# Patient Record
Sex: Female | Born: 1980 | Race: White | Hispanic: No | Marital: Married | Smoking: Never smoker
Health system: Southern US, Community
[De-identification: ages and names within clinical notes are randomized; demographics above are authoritative.]

## PROBLEM LIST (undated history)

## (undated) DIAGNOSIS — F32A Depression, unspecified: Secondary | ICD-10-CM

## (undated) DIAGNOSIS — T7840XA Allergy, unspecified, initial encounter: Secondary | ICD-10-CM

## (undated) DIAGNOSIS — O4400 Placenta previa specified as without hemorrhage, unspecified trimester: Secondary | ICD-10-CM

## (undated) DIAGNOSIS — R5383 Other fatigue: Secondary | ICD-10-CM

## (undated) DIAGNOSIS — Z3481 Encounter for supervision of other normal pregnancy, first trimester: Secondary | ICD-10-CM

## (undated) DIAGNOSIS — Z3A09 9 weeks gestation of pregnancy: Secondary | ICD-10-CM

## (undated) DIAGNOSIS — M545 Low back pain, unspecified: Secondary | ICD-10-CM

## (undated) DIAGNOSIS — O4402 Placenta previa specified as without hemorrhage, second trimester: Secondary | ICD-10-CM

## (undated) DIAGNOSIS — O4440 Low lying placenta NOS or without hemorrhage, unspecified trimester: Secondary | ICD-10-CM

## (undated) DIAGNOSIS — F419 Anxiety disorder, unspecified: Secondary | ICD-10-CM

## (undated) HISTORY — DX: Allergy, unspecified, initial encounter: T78.40XA

## (undated) HISTORY — PX: WISDOM TOOTH EXTRACTION: SHX21

## (undated) HISTORY — DX: Depression, unspecified: F32.A

## (undated) HISTORY — DX: Anxiety disorder, unspecified: F41.9

---

## 2014-03-19 ENCOUNTER — Ambulatory Visit
Admit: 2014-03-19 | Discharge: 2014-03-19 | Payer: PRIVATE HEALTH INSURANCE | Attending: Advanced Practice Midwife | Primary: Family Medicine

## 2014-03-19 DIAGNOSIS — O99345 Other mental disorders complicating the puerperium: Secondary | ICD-10-CM

## 2014-03-19 NOTE — Progress Notes (Signed)
Angela Raymond  03/19/2014    Date Of Birth:  24-Dec-1980      Chief Complaint   Patient presents with   ??? Contraception     Discuss BC            HPI:  Angela AllegraHeather Raymond is a 33 y.o. femaleThe patient was seen today. She is here regarding postpartum depression and needs birth control.  Has been using condoms, but had one break and the thought of pregnancy now really scared her.  She has seen a psychiatrist for her PP anxiety and depression and they told her to start Lexapro in Sept.  She hasn't done it yet and is very tearful.  Knows she will likely feel better by taking it.  She is still breastfeeding her 279 month old with mixed feelings about it.  Will likely let her wean within the next couple months.  Took a pill in the past, but interested in having another baby maybe a year from now.  Would consider vaginal ring.   Her sister is having some thyroid issues and she would like her labs to be checked along with vit D.  Her bowels are regular and she is voiding without difficulty.       Obstetric History    G1   P1   T0   P1   A0   TAB0   SAB0   E0   M0   L1       # Outcome Date GA Lbr Len/2nd Weight Sex Delivery Anes PTL Lv   1 Preterm 06/2013 [id number]  5 lb 7 oz (2.466 kg) F Vag-Spont   Y          Past Medical History   Diagnosis Date   ??? Depression 2003     counseling   ??? Anxiety 2003     counseling   ??? Abnormal Pap smear of cervix 2007       No past surgical history on file.    Family History   Problem Relation Age of Onset   ??? Diabetes Paternal Grandmother    ??? Osteoporosis Maternal Grandmother    ??? Breast Cancer Maternal Grandmother    ??? Hypertension Father    ??? Thyroid Disease Mother    ??? Thyroid Cancer Mother    ??? Mental Illness Maternal Uncle        History     Social History   ??? Marital Status: Married     Spouse Name: N/A     Number of Children: N/A   ??? Years of Education: N/A     Occupational History   ??? Not on file.     Social History Main Topics   ??? Smoking status: Never Smoker    ??? Smokeless tobacco: Not on file    ??? Alcohol Use: Yes   ??? Drug Use: Not on file   ??? Sexual Activity: Not on file     Other Topics Concern   ??? Not on file     Social History Narrative         MEDICATIONS:  Current Outpatient Prescriptions   Medication Sig Dispense Refill   ??? Prenatal Vit-Fe Fumarate-FA (PRENATAL PO) Take by mouth       No current facility-administered medications for this visit.             ALLERGIES:  Allergies as of 03/19/2014   ??? (No Known Allergies)  PERTINENT POSITIVES SEE HPI    BP 114/62 mmHg   Wt 116 lb (52.617 kg)   LMP 03/16/2014     General: A&O x3 NAD  Eyes:normal sclera  Neck: Supple  Neuro: no gross motor deficits  Extremities: No calf tenderness, DTR 2+, and No edema bilaterally  Psych: normal affect and mood      Diagnostics:    No results found.     Lab Results:  No results found for this or any previous visit.      Angela Raymond was seen today for contraception.    Diagnoses and associated orders for this visit:    Postpartum anxiety  - TSH without Reflex; Future  - T4, free; Future  - Vitamin D 1,25 Dihydroxy; Future  - TSH without Reflex  - T4, free  - Vitamin D 1,25 Dihydroxy           Return in about 3 months (around 06/19/2014) for Annual, Labs to be done Today.      Patient was seen with total face to face time of 30 minutes. More than 50% of this visit was counseling and education regarding The encounter diagnosis was Postpartum anxiety. and Contraception   as well as  counseling on preventative health maintenance follow-up.

## 2014-03-20 LAB — T4, FREE: T4 Free: 0.92 nG/dL (ref 0.76–1.46)

## 2014-03-20 LAB — TSH: TSH: 1.28 u[IU]/mL (ref 0.358–3.74)

## 2014-03-20 LAB — VITAMIN D 25 HYDROXY: Vit D, 25-Hydroxy: 26.1 nG/mL — ABNORMAL LOW (ref 30–100)

## 2014-03-20 MED ORDER — ETONOGESTREL-ETHINYL ESTRADIOL 0.12-0.015 MG/24HR VA RING
VAGINAL | Status: DC
Start: 2014-03-20 — End: 2014-07-02

## 2014-03-20 NOTE — Telephone Encounter (Signed)
Can you tell me what our Vit D Protocol is please.  Thanks, Vernona RiegerLaura

## 2014-03-20 NOTE — Telephone Encounter (Signed)
-----   Message from Laura D Neal, CNM sent at 03/20/2014  1:33 PM EST -----  Please notify pt that her thyroid labs are normal, but her vitamin D level is low at 26.  Please place her on the Vitamin D protocol.  Thanks, LN

## 2014-03-20 NOTE — Telephone Encounter (Signed)
Aware, Please send in Pended medication as above.    Patient is still using Nuvaring samples.

## 2014-03-20 NOTE — Telephone Encounter (Signed)
LM for pt to return our call.

## 2014-03-20 NOTE — Telephone Encounter (Signed)
-----   Message from Nyra JabsLaura D Neal, CNM sent at 03/20/2014  1:33 PM EST -----  Please notify pt that her thyroid labs are normal, but her vitamin D level is low at 26.  Please place her on the Vitamin D protocol.  Thanks, LN

## 2014-03-27 NOTE — Telephone Encounter (Signed)
OFFICE INFO calling to check on Vitamin D Rx [facility] [phone]

## 2014-03-28 NOTE — Telephone Encounter (Signed)
Protocol-  If Vitamin D3 level is low- will need to take 50,000 IU qweek x12 & then have level rechecked- D3 medication is pended- okay to fax- pt will check w/ pharmacy later

## 2014-04-01 NOTE — Telephone Encounter (Signed)
Vitamin D3 is pended above,  Please Send to [facility]. 2nd request.   To LNeal.

## 2014-04-02 MED ORDER — CHOLECALCIFEROL 1.25 MG (50000 UT) PO CAPS
50000 UNIT | ORAL_CAPSULE | ORAL | Status: DC
Start: 2014-04-02 — End: 2014-04-16

## 2014-04-02 NOTE — Telephone Encounter (Signed)
Thanks for protocol.  Rx pended was 50,000 IU daily not weekly.  I had to be sure the right dosage was weekly but didn't know the protocol.  I changed and signed the rx and sent it to pharmacy.  Thanks, LN

## 2014-04-02 NOTE — Telephone Encounter (Signed)
Pt called and said that she started taking Lexapro for two weeks now and she has been experiencing heart palpations, also she has broken out in hives three separate times. She had taken Benadryl to help with the hives and a cold compress. Heart palpations are daily and are off and on.

## 2014-04-07 NOTE — Telephone Encounter (Signed)
Please tell pt to cut the pill in 1/2 and only take 1/2 tablet. Thanks

## 2014-04-07 NOTE — Telephone Encounter (Signed)
Spoke with patient, relayed Dr. Casandra DoffingHutton's message. She is going to try this for a week and if it continues she will call the office back.

## 2014-04-16 MED ORDER — VITAMIN D3 75 MCG (3000 UT) PO TABS
75 MCG (3000 UT) | ORAL_TABLET | Freq: Every day | ORAL | Status: DC
Start: 2014-04-16 — End: 2015-08-31

## 2014-04-16 NOTE — Telephone Encounter (Signed)
Pt calls, was prescribed Vit D3 50000 IU for Vit D protocol, took first capsule last week, was "bloated and gassy for 4 days", took second capsule yesterday, has been "very nauseated yesterday and today", states that she can't think of any other reason, except that she took the Vit D3; wants to know if she can possibly take a smaller dose of it.  Please advise.  Triage, pt wants a message left on her cell phone, informed her limited info would be left, due to privacy act.  Pt states that she doesn't want to sit on hold for 20 minutes until she can speak with someone.  Message sent to LN for review.

## 2014-04-16 NOTE — Telephone Encounter (Signed)
I sent in a rx for her to take 2 capsules of 3,000 IU daily.  This will add up to 42,000 IU weekly, which is slightly less than 50,000.  But the only thing I could order by prescription.  She could by some OTC and take an additional 8,000 IU D3 throughout the week if she wants.  Then she has a total of the 50,000 IU just divided up and may tolerate it better.  Please advise pt.  Thanks, LN

## 2014-04-17 NOTE — Telephone Encounter (Signed)
LM on patient's voice mail, as requested.

## 2014-04-24 ENCOUNTER — Encounter: Attending: Psychiatry | Primary: Family Medicine

## 2014-04-30 NOTE — Telephone Encounter (Signed)
Patient doesn't feel like her Lexapro is working right and is having adverse reactions to it. Would like you to call her back when you are available.

## 2014-04-30 NOTE — Telephone Encounter (Signed)
Pt was prescribed a NuvaRing but would rather have an oral contraception. Also thinks the Lexapro prescribed by psychiatrist is not  working and she has a side effect of a low libido.

## 2014-05-01 MED ORDER — NORETHIN ACE-ETH ESTRAD-FE 1-20 MG-MCG PO TABS
1-20 MG-MCG | PACK | Freq: Every day | ORAL | Status: DC
Start: 2014-05-01 — End: 2014-07-02

## 2014-05-01 NOTE — Telephone Encounter (Signed)
I will send in Rx for an oral contraceptive pill.  To start with her next period.  She should talk to her psychiatrist about her medication not working.  Low libido can be a side effect of the antidepressants.  Low libido also is very normal when having a young baby.  Please notify pt, thanks, LN

## 2014-05-06 NOTE — Telephone Encounter (Signed)
PT called to status of message. PT stated she did not take the LExapro yesterday. Having bad dreams, medication not helping depression. WOuld like to talk to someone.

## 2014-05-08 NOTE — Telephone Encounter (Signed)
LM for pt to return our call.

## 2014-05-12 MED ORDER — BUPROPION HCL ER (XL) 150 MG PO TB24
150 MG | ORAL_TABLET | Freq: Every morning | ORAL | Status: DC
Start: 2014-05-12 — End: 2014-07-02

## 2014-05-12 NOTE — Telephone Encounter (Signed)
Left detailed message on machine: since pt has stopped lexapro will start wellbutrin which has helped mom and sister and decreased sexual side effects as well. Risks and benefits stated.  Script sent.  Pt has f/u appt 2/17

## 2014-05-13 NOTE — Telephone Encounter (Signed)
Pt notified. Pt has appts. Needed to f/u on her low Libido and antidepressant. Will call if needs anything.

## 2014-06-25 ENCOUNTER — Encounter: Attending: Psychiatry | Primary: Family Medicine

## 2014-06-25 ENCOUNTER — Encounter: Attending: Advanced Practice Midwife | Primary: Family Medicine

## 2014-07-02 ENCOUNTER — Ambulatory Visit
Admit: 2014-07-02 | Discharge: 2014-07-02 | Payer: PRIVATE HEALTH INSURANCE | Attending: Advanced Practice Midwife | Primary: Family Medicine

## 2014-07-02 DIAGNOSIS — Z01419 Encounter for gynecological examination (general) (routine) without abnormal findings: Secondary | ICD-10-CM

## 2014-07-02 MED ORDER — NORETHINDRONE ACET-ETHINYL EST 1-20 MG-MCG PO TABS
1-20 MG-MCG | ORAL_TABLET | Freq: Every day | ORAL | Status: DC
Start: 2014-07-02 — End: 2014-11-26

## 2014-07-02 NOTE — Progress Notes (Signed)
SUMAN TRIVEDI  07/02/2014              34 y.o.            Primary Care Physician: No primary provider on file.  Chief Complaint   Patient presents with   ??? Gynecologic Exam     HPI : Angela Raymond is a 34 y.o. female here for annual exam.  ____________________________________________________________________  Gynecologic History:    Patient's last menstrual period was 06/24/2014 (approximate).  Menses are regular. Menses occur every regular every 28-30 days.  Flow is moderate.  Intermenstrual bleeding: No  Dysmenorrhea:No    Daughter is turning 59 year old.  Married.  May plan another pregnancy in the next year, will take PNV prior.  Stopped taking her Lexapro due to sexual side effects that resolved after she stopped.  She has been seeing her counselor and is managing her anxiety well now.  Taking 6000 IU of Vit D daily, was checked by her PCP and level was up to normal, although pt was unsure of level.  She will send [place] a message letting us know what her level of Vit D increased to.  Would like to start birth control pill.  Done nursing, did it until 11 months.      Sexually Active: Yes    STD History: No    Reversible Birth Control:Yes: condoms, didn't start prior birth control pill rx that was sent        Preventative Health Testing:  Date of Last Pap Smear: no ecc last pap, done today      Obstetric History    G1   P1   T0   P1   A0   TAB0   SAB0   E0   M0   L1       # Outcome Date GA Lbr Len/2nd Weight Sex Delivery Anes PTL Lv   1 Preterm 06/2013 [redacted]w[redacted]d  5 lb 7 oz (2.466 kg) F Vag-Spont   Y        Past Medical History   Diagnosis Date   ??? Depression 2003     counseling   ??? Anxiety 2003     counseling   ??? Abnormal Pap smear of cervix 2007                                                                   History reviewed. No pertinent past surgical history.  Family History   Problem Relation Age of Onset   ??? Diabetes Paternal Grandmother    ??? Osteoporosis Maternal Grandmother    ??? Breast Cancer Maternal  Grandmother    ??? Hypertension Father    ??? Thyroid Disease Mother    ??? Thyroid Cancer Mother    ??? Mental Illness Maternal Uncle      History     Social History   ??? Marital Status: Married     Spouse Name: N/A     Number of Children: N/A   ??? Years of Education: N/A     Occupational History   ??? Not on file.     Social History Main Topics   ??? Smoking status: Never Smoker    ??? Smokeless tobacco: Never Used   ??? Alcohol  Use: 0.0 oz/week     0 Not specified per week   ??? Drug Use: Not on file   ??? Sexual Activity:     Partners: Male      Comment: none     Other Topics Concern   ??? Not on file     Social History Narrative       MEDICATIONS:  Current Outpatient Prescriptions   Medication Sig Dispense Refill   ??? Multiple Vitamins-Minerals (MULTIVITAMIN ADULT PO) Take by mouth daily     ??? Cholecalciferol (VITAMIN D3) 3000 UNITS TABS Take 2 capsules by mouth daily 60 tablet 2     No current facility-administered medications for this visit.       ALLERGIES:  Allergies as of 07/02/2014   ??? (No Known Allergies)       REVIEW OF SYSTEMS:     CONSTIUTIONAL: No fever, chills or malaise; No weight change or fatigue  CV: No Chest Pain with Exertion, Palpitations, Syncope, Edema, Arrhythmia  RESPIRATORY: No SOB, Pneumoniae,Cough,   BREAST: No breast abnormalities or lumps  GI: No Indigestion, Heartburn, Nausea, vomiting, Diarrhea, Constipation,Bloating or Bowel Changes; No Bloody Stools or melena  GU: No Dysuria, Hematuria or Nocturia. No Urinary Incontinence or Vaginal Discharge,vaginal bleeding, or dysparuenia.   NEURO: No CVA, Migraines, Epilepsy, Seizure Hx, or Limb Weakness  DERM: No Rash, Itching, Hives, Mole Changes or Cancer  PSYCH: No Depression, Homicidal thoughts,suicidal thoughts, or anxiety  MUSCULOSKELETAL: No Arthralgia or Arthritis  HEME and LYMPH :No Lymphoma, Von Willebrand's, Hemophillia or Bleeding History                                                                                                                                                                                 PHYSICAL EXAM:       Filed Vitals:    07/02/14 1633   BP: 118/80   Height:  (1.575 m)   Weight: 119 lb 6.4 oz (54.159 kg)     Body mass index is 21.83 kg/(m^2).    GYN EXAM:   BREASTS: normal, no masses, tenderness or skin changes.   EXTERNAL GENITALIA: normal female structures  VAGINA: normal ruggae, no lesions  CERVIX: no lesions, no cervical motion tenderness, normal appearance.   UTERUS: normal mobility, nontender, normal size, shape and consistency.   ADNEXA: normal, non tender no masses.   URETHRA: normal. nontender  BLADDER: non tender.   PELVIC SUPPORT DEFECTS: Normal support of vagina, uterus, and bladder  ANUS/PERINEUM: no hemorrhoids, masses or warts noted.     GENERAL EXAM   CONSTITUTIONAL:  Well developed, well nourished, well groomed. no acute distress  NECK: no thyromegaly, supple.   CARDIOVASCULAR:  normal rate and rhythm, no edema   LUNGS: Normal effort, normal lung sounds  ABDOMEN:soft, non-tender, non-distended, no hepatospleenomegaly  NEUROLOGICAL: no gross motor or sensory deficits noted. Truddie Hidden.   MUSCULOSKETAL: normal gait, no cyanosis.   PSYCHIATRIC Normal mood and affect, A&O x3.        ASSESSMENT/PLAN:  Herbert SetaHeather was seen today for gynecologic exam.    Diagnoses and associated orders for this visit:    Encounter for gynecological examination without abnormal finding  - Pap Smear    Encounter for other contraceptive management    Postpartum anxiety        Return in about 1 year (around 07/03/2015) for Annual.     Rx sent for microgestin with instructions.        Birth control and barrier recommendations discussed.  STD counseling and prevention reviewed.  Gardisil counseling completed for all patients 9-26 yo.  Routine health maintenance per patients PCP.    Osteoporosis is a disease process that begins in young women. Recommended daily intake is Calcium 1000mg  and Vitamin D 600IU daily to help build strong bones for later in life. Healthy  diet and exercise is encouraged, goal is a normal BMI of <25. ACOG Pap smear guidelines not recommended for women under the age of 32. For women aged 32-32 years old, ACOG recommends a pap every 3 years. Starting at age 32, it is recommended to have both a pap test and HPV test. An annual exam with pelvic exam and physician discussion is still recommended even if a pap smear is not done at each visit. Recommended screening for STDs to help ensure future fertility and decrease risk of pelvic inflammatory disease. Respect your body and protect it from STDs and pregnancy by requesting your partner to wear a condom. It is recommended to obtain the HPV vaccine for cervical cancer prevention for women up to age 32. Be up to date on vaccines including influenza, Tetanus (DPT), Rubella, and Varicella (chicken pox).

## 2014-07-04 LAB — HPV, HIGH RISK
HPV High Risk 12 DNA: NOT DETECTED
HPV, Genotype 16: NOT DETECTED
HPV, Genotype 18: NOT DETECTED

## 2014-07-07 LAB — PAP SMEAR

## 2014-07-22 NOTE — Telephone Encounter (Signed)
VITAMIN D 1,25 DIHYDROXY (Order #[id number]) on 03/19/14 was not done. Vitamin D-25 Hydroxy is on chart. Please task Triage if you would like pt contacted or return for cancellation.

## 2014-07-23 NOTE — Telephone Encounter (Signed)
She had this done with her PCP.  No need to do it here.  Thx, LN

## 2014-09-12 NOTE — Telephone Encounter (Signed)
S: wants to change birth control  B: used to use ortho cycline sprintec, now has prescription for Norethindrone (Microgestin)  A: skin break out, moody, would like to change BC to ortho cycline sprintec  R:DISPOSITION: Call Physician within 24 Hours - this TE sent for request, pharmacy verified,

## 2014-09-16 MED ORDER — NORGESTIMATE-ETH ESTRADIOL 0.25-35 MG-MCG PO TABS
PACK | Freq: Every day | ORAL | Status: DC
Start: 2014-09-16 — End: 2014-11-26

## 2014-09-19 NOTE — Telephone Encounter (Signed)
Patient is aware.

## 2014-11-26 ENCOUNTER — Ambulatory Visit
Admit: 2014-11-26 | Discharge: 2014-11-26 | Payer: PRIVATE HEALTH INSURANCE | Attending: Obstetrics & Gynecology | Primary: Family Medicine

## 2014-11-26 DIAGNOSIS — R350 Frequency of micturition: Secondary | ICD-10-CM

## 2014-11-26 LAB — POCT URINALYSIS DIPSTICK
Bilirubin, UA: NEGATIVE
Blood, UA POC: NEGATIVE
Glucose, UA POC: NEGATIVE
Leukocytes, UA: NEGATIVE
Nitrite, UA: NEGATIVE
Spec Grav, UA: 1.03
Urobilinogen, UA: NEGATIVE
pH, UA: 6

## 2014-11-26 MED ORDER — KETOCONAZOLE 200 MG PO TABS
200 MG | ORAL_TABLET | Freq: Every day | ORAL | 0 refills | Status: DC
Start: 2014-11-26 — End: 2015-08-31

## 2014-11-26 NOTE — Patient Instructions (Signed)
Vaginal Yeast Infection: Care Instructions  Your Care Instructions  A vaginal yeast infection is caused by too many yeast cells in the vagina. This is common in women of all ages. Itching, vaginal discharge and irritation, and other symptoms can bother you. But yeast infections don't often cause other health problems.  Some medicines can increase your risk of getting a yeast infection. These include antibiotics, birth control pills, hormones, and steroids. You may also be more likely to get a yeast infection if you are pregnant, have diabetes, douche, or wear tight clothes.  With treatment, most yeast infections get better in 2 to 3 days.  Follow-up care is a key part of your treatment and safety. Be sure to make and go to all appointments, and call your doctor if you are having problems. It's also a good idea to know your test results and keep a list of the medicines you take.  How can you care for yourself at home?  ?? Take your medicines exactly as prescribed. Call your doctor if you think you are having a problem with your medicine.  ?? Ask your doctor about over-the-counter (OTC) medicines for yeast infections. They may cost less than prescription medicines. If you use an OTC treatment, read and follow all instructions on the label.  ?? Do not use tampons while using a vaginal cream or suppository. The tampons can absorb the medicine. Use pads instead.  ?? Wear loose cotton clothing. Do not wear nylon or other fabric that holds body heat and moisture close to the skin.  ?? Try sleeping without underwear.  ?? Do not scratch. Relieve itching with a cold pack or a cool bath.  ?? Do not wash your vaginal area more than once a day. Use plain water or a mild, unscented soap. Air-dry the vaginal area.  ?? Change out of wet swimsuits after swimming.  ?? Do not have sex until you have finished your treatment.  ?? Do not douche.  When should you call for help?  Call your doctor now or seek immediate medical care if:  ?? You have  unexpected vaginal bleeding.  ?? You have new or increased pain in your vagina or pelvis.  Watch closely for changes in your health, and be sure to contact your doctor if:  ?? You have a fever.  ?? You are not getting better after 2 days.  ?? Your symptoms come back after you finish your medicines.   Where can you learn more?   Go to [email or web address] and sign in to your MyChart account. Enter F639 in the Search Health Information box to learn more about ???Vaginal Yeast Infection: Care Instructions.???    If you do not have an account, please click on the ???Sign Up Now??? link.   ?? 2006-2016 [employer]. Care instructions adapted under license by [facility] [employer]. This care instruction is for use with your licensed healthcare professional. If you have questions about a medical condition or this instruction, always ask your healthcare professional. [employer] Inc[employer] disclaims any warranty or liability for your use of this information.  Content Version: 10.8.513193; Current as of: December 02, 2013

## 2014-11-26 NOTE — Progress Notes (Signed)
Chief Complaint   Patient presents with   ??? Gynecologic Exam     c/o discharge, burning and itching       Patient's last menstrual period was 11/17/2014.     History:    Past Medical History   Diagnosis Date   ??? Abnormal Pap smear of cervix 2007   ??? Anxiety 2003     counseling   ??? Depression 2003     counseling       History reviewed. No pertinent past surgical history.    Family History   Problem Relation Age of Onset   ??? Diabetes Paternal Grandmother    ??? Osteoporosis Maternal Grandmother    ??? Breast Cancer Maternal Grandmother    ??? Hypertension Father    ??? Thyroid Disease Mother    ??? Thyroid Cancer Mother    ??? Mental Illness Maternal Uncle        Social History     Social History   ??? Marital status: Married     Spouse name: N/A   ??? Number of children: N/A   ??? Years of education: N/A     Social History Main Topics   ??? Smoking status: Never Smoker   ??? Smokeless tobacco: Never Used   ??? Alcohol use 0.0 oz/week     0 Standard drinks or equivalent per week   ??? Drug use: None   ??? Sexual activity: Yes     Partners: Male      Comment: none     Other Topics Concern   ??? None     Social History Narrative       Allergies:    No Known Allergies    Medications:    Current Outpatient Prescriptions on File Prior to Visit   Medication Sig Dispense Refill   ??? Multiple Vitamins-Minerals (MULTIVITAMIN ADULT PO) Take by mouth daily     ??? Cholecalciferol (VITAMIN D3) 3000 UNITS TABS Take 2 capsules by mouth daily 60 tablet 2     No current facility-administered medications on file prior to visit.        HPI:    HPI  She presents with complaints of vaginal itching and irritation, the disharge is white and thick  And present for 3 weeks. She has been on augmentin for 10 days she stopped it tow days ago.  She has not tried anything over the counter except generic monistat 1. It helped for 1 day and then symptoms resumed. She took diflucan and it resolved for 2 days and then returned. No h/o std , denies any new partners. Declines std  testing  ROS:    Review of Systems   Constitutional: Negative for chills and fever.   Gastrointestinal: Negative for abdominal pain, constipation and nausea.   Genitourinary: Positive for dyspareunia and vaginal discharge. Negative for difficulty urinating, dysuria and menstrual problem.       Physical exam:    Physical Exam   Constitutional: She is oriented to person, place, and time. She appears well-developed and well-nourished.   Abdominal: She exhibits no distension and no mass. There is no tenderness. There is no rebound and no guarding.   Genitourinary: Uterus normal. Vaginal discharge found.   Neurological: She is alert and oriented to person, place, and time.   Skin: Skin is warm and dry.   Psychiatric: She has a normal mood and affect. Her behavior is normal. Judgment and thought content normal.       Assessment and Plan:  Assessment    1. Urinary frequency  POCT Urinalysis no Micro   2. Vaginal discharge           Plan  we discussed ways to help prevent yeast infections.

## 2014-11-27 LAB — VAGINAL PATHOGENS PROBE *A
Candida Species, DNA Probe: NEGATIVE
Gardnerella Vaginalis, DNA Probe: NEGATIVE
Trichomonas Vaginalis DNA: NEGATIVE

## 2015-02-06 ENCOUNTER — Encounter: Attending: Registered Nurse | Primary: Family Medicine

## 2015-03-12 ENCOUNTER — Ambulatory Visit
Admit: 2015-03-12 | Discharge: 2015-03-12 | Payer: PRIVATE HEALTH INSURANCE | Attending: Registered Nurse | Primary: Family Medicine

## 2015-03-12 DIAGNOSIS — F331 Major depressive disorder, recurrent, moderate: Secondary | ICD-10-CM

## 2015-03-12 NOTE — Patient Instructions (Signed)
Sertraline(Zoloft) 50mg  take 1/2 for 1st 4 days, then start 1 tab daily...   Increase self care-- establish a sleep schedule, healthier diet, exercise regularly.[place].  [facility] [phone]  [facility]  [phone]

## 2015-03-12 NOTE — Progress Notes (Signed)
Subjective:        Chief Complaints:      Follow up          HPI:   Visit Duration: 3:05/3:50/45 min   Interim History: Pt present for f/u appt, 1st with this provider.  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 .  Had tried Escitalopram but had some sexual dysfunction.  Feels she is always going and up and down in mood.  Daughter is 2278yr- and pt still feels uninterested in sex.  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 Know that eventually life situation will calm. Pt feels guilty because husband cooks and she is supposed to clean up and does not do it very often.  Pt endorses some SAD.  Agreed to see a counselor re: sexual/couples counseling. Pt reports mother reinforced very negative attitude towards sex growing up.  Sleep-fair as it is exhausted sleep.  Appetite good.  Talked in length about self care.  Pt just recently started exercising again- walking at lunch.  Described recent vaca- could not relax- remains anxious.      Target Signs/Symptoms:   Anxious, depressed...           Gyn History: Period monthly           Medications:   Current Outpatient Prescriptions   Medication Sig Dispense Refill   ??? buPROPion (WELLBUTRIN XL) 150 MG XL tablet TAKE 1 TABLET BY MOUTH EVERY DAY  2   ??? ketoconazole (NIZORAL) 200 MG tablet Take 1 tablet by mouth daily 5 tablet 0   ??? Multiple Vitamins-Minerals (MULTIVITAMIN ADULT PO) Take by mouth daily     ??? Cholecalciferol (VITAMIN D3) 3000 UNITS TABS Take 2 capsules by mouth daily 60 tablet 2     No current facility-administered medications for this visit.              Allergies: No Known Allergies         Objective:        Examination:   Mental Status Exam:   GENERAL OBSERVATIONS :.   Appearance appropriately dressed and healthy looking   Demeanor  cooperative  Activity normal.   Eye Contact good    SPEECH   Rate normal  Volume  appropriate  Articulation clear.   Coherent yes.   Spontaneous  no.   MOOD & AFFECT :.   Depression  moderate  Anxiety moderate  Anger denies  Anhedonia  mild  Euphoria mild  Irritability mild  Affect depressed and anxious  Associations logical.   Process Goal-Directed  THOUGHT CONTENT :.   Hallucinations No  Delusions. No  Suicidality no plan  Homicidality no plan   Judgement good   Insight Good  Orientation time yes person yes situation yes place yes.   Attention fair   Concentration fair   Memory Recent: intact Remote: intact.   Language Naming: intact Repetition: intact.   Condition:  not changed  Prognosis:  fair           Examination:   Mental Status Exam:                ASSESSMENT/PLAN:    Angela SetaHeather was seen today for follow-up.    Diagnoses and all orders for this visit:    Moderate episode of recurrent major depressive disorder (HCC)      FOLLOW-UP:  2-3 m

## 2015-03-16 MED ORDER — SERTRALINE HCL 50 MG PO TABS
50 MG | ORAL_TABLET | Freq: Every day | ORAL | 0 refills | Status: DC
Start: 2015-03-16 — End: 2015-05-19

## 2015-03-16 NOTE — Telephone Encounter (Signed)
Not sure what happened, resent it.Marland Kitchen..Marland Kitchen

## 2015-03-16 NOTE — Telephone Encounter (Signed)
Pt called and LMOM stating that you were suppose to [facility] and we do not have any record of it being printed or sent. She would like to know if you'd be able to send a Rx in?

## 2015-03-16 NOTE — Telephone Encounter (Signed)
Called pt, LMOM relayed message.

## 2015-03-24 NOTE — Telephone Encounter (Signed)
Called and LM as f/u from appt

## 2015-04-01 NOTE — Telephone Encounter (Signed)
No call back

## 2015-05-18 ENCOUNTER — Encounter: Attending: Registered Nurse | Primary: Family Medicine

## 2015-05-19 MED ORDER — SERTRALINE HCL 50 MG PO TABS
50 MG | ORAL_TABLET | Freq: Every day | ORAL | 0 refills | Status: DC
Start: 2015-05-19 — End: 2015-08-31

## 2015-05-19 NOTE — Telephone Encounter (Signed)
Patient did stop taking Zoloft before the holiday, bc she has heart palpitations for 3 days, but would like to start med again before she comes in to see you again on 06/11/15.

## 2015-05-29 ENCOUNTER — Encounter: Attending: Registered Nurse | Primary: Family Medicine

## 2015-06-11 ENCOUNTER — Encounter: Attending: Registered Nurse | Primary: Family Medicine

## 2015-07-14 ENCOUNTER — Encounter: Attending: Advanced Practice Midwife | Primary: Family Medicine

## 2015-08-31 ENCOUNTER — Ambulatory Visit
Admit: 2015-08-31 | Discharge: 2015-08-31 | Payer: PRIVATE HEALTH INSURANCE | Attending: Advanced Practice Midwife | Primary: Family Medicine

## 2015-08-31 DIAGNOSIS — Z01419 Encounter for gynecological examination (general) (routine) without abnormal findings: Secondary | ICD-10-CM

## 2015-08-31 NOTE — Progress Notes (Signed)
Angela Raymond is a 35 y.o. female who presents today with:   Chief Complaint   Patient presents with   ??? Gynecologic Exam       HPI: here for an annual exam  Doing well  Never started BCP  Now wants a pregnancy in the late summer  On PNV and off all anti anxiety and antidepressants   Had bad PPD  Will plan to start Zoloft PP  Cycles monthly but recently has had bleeding at what sounds like ovulation 2-3 days mid cycle light normal period 5-6 days heavy  Had a colonoscopy about 1 year ago told to follow-up for another one this year as had 2 large polyps  Plans to go.  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 Abnormal Pap smear of cervix 2007   ??? Anxiety 2003    counseling   ??? Depression 2003    counseling      Past Surgical History:   Procedure Laterality Date   ??? COLONOSCOPY  06/2014     Family History   Problem Relation Age of Onset   ??? Diabetes Paternal Grandmother    ??? Osteoporosis Maternal Grandmother    ??? Breast Cancer Maternal Grandmother    ??? Thyroid Cancer Mother    ??? Mental Illness Maternal Uncle    ??? Thyroid Cancer Sister 48     Social History     Social History   ??? Marital status: Married     Spouse name: N/A   ??? Number of children: N/A   ??? Years of education: N/A     Occupational History   ??? Not on file.     Social History Main Topics   ??? Smoking status: Never Smoker   ??? Smokeless tobacco: Never Used   ??? Alcohol use 0.0 oz/week     0 Standard drinks or equivalent per week      Comment: 1-2 every other week   ??? Drug use: Not on file   ??? Sexual activity: Not on file      Comment: none     Other Topics Concern   ??? Not on file     Social History Narrative       Medications:  Current Outpatient Prescriptions   Medication Sig Dispense  Refill   ??? Prenatal Vit-Fe Fumarate-FA (PRENATAL VITAMIN PO) Take by mouth       No current facility-administered medications for this visit.        Allergies:  Allergies as of 08/31/2015   ??? (No Known Allergies)       Review of Systems   Constitutional: Negative.    HENT: Negative.  Negative for congestion and dental problem.    Eyes: Negative.    Respiratory: Negative.    Cardiovascular: Negative.    Gastrointestinal: Negative.    Endocrine: Negative.    Genitourinary: Negative.    Musculoskeletal: Negative.  Negative for back pain, joint swelling and myalgias.   Skin: Negative.    Allergic/Immunologic: Negative.    Neurological: Negative.    Hematological: Negative.    Psychiatric/Behavioral: Negative.  Negative for agitation, behavioral problems, confusion, decreased concentration, dysphoric mood  and sleep disturbance. The patient is not nervous/anxious and is not hyperactive.        Physical Exam   Constitutional: She is oriented to person, place, and time. She appears well-developed and well-nourished.   HENT:   Head: Normocephalic.   Neck: Normal range of motion. No tracheal deviation present. No thyromegaly present.   Cardiovascular: Normal rate, regular rhythm and normal heart sounds.    Pulmonary/Chest: Effort normal and breath sounds normal. Right breast exhibits no inverted nipple, no mass, no nipple discharge, no skin change and no tenderness. Left breast exhibits no inverted nipple, no mass, no nipple discharge, no skin change and no tenderness. Breasts are symmetrical.   Abdominal: Soft. She exhibits no distension and no mass. There is no tenderness. There is no rebound and no guarding. Hernia confirmed negative in the right inguinal area and confirmed negative in the left inguinal area.   Genitourinary: Rectum normal and uterus normal. No breast swelling, tenderness, discharge or bleeding. No labial fusion. There is no rash, tenderness, lesion or injury on the right labia. There is no rash,  tenderness, lesion or injury on the left labia. Cervix exhibits no motion tenderness, no discharge and no friability. Right adnexum displays no mass, no tenderness and no fullness. Left adnexum displays no mass, no tenderness and no fullness. No erythema, tenderness or bleeding in the vagina. No foreign body in the vagina. No signs of injury around the vagina. No vaginal discharge found.   Musculoskeletal: Normal range of motion. She exhibits no edema or tenderness.   Lymphadenopathy:        Right: No inguinal adenopathy present.        Left: No inguinal adenopathy present.   Neurological: She is alert and oriented to person, place, and time.   Skin: Skin is warm and dry. No rash noted. No erythema.   Psychiatric: She has a normal mood and affect. Her behavior is normal. Thought content normal.       BP 115/64   Ht 5\' 2"  (1.575 m)   Wt 121 lb (54.9 kg)   LMP 08/23/2015 (Approximate)   BMI 22.13 kg/m2    Assessment/Plan:    1. Encounter for gynecological examination without abnormal finding     2. Encounter for other general counseling or advice on contraception       Return in about 1 year (around 08/30/2016) for annual.or when pregnant

## 2016-05-09 NOTE — L&D Delivery Note (Signed)
CNM Vaginal Delivery Note    Department of Obstetrics and Gynecology        Patient: Angela Raymond   DOB: 12-12-1980  MRN: [id number]   Date of delivery: 12/23/16     Pre-operative Diagnosis: CYNITHA OBERHOLTZER [id number] at [redacted]w[redacted]d, Term pregnancy, Spontaneous labor, Single fetus and Uncomplicated pregnancy    Post-operative Diagnosis:  S/p spontaneous vaginal birth of viable female infant    Delivering Provider & Assistant(s): Lanney Gins, CNM; Romie Levee, SNM    Infant Information:   Information for the patient's newborn:  [name], Scheidt [[id number]]        Information for the patient's newborn:  Payslee, Matthewson [[id number]]   One Minute Apgar: 9  Five Minute Apgar: 9      Anesthesia:  none    Delivery:    The patient is a 36 y.o. G2P1102 at [redacted]w[redacted]d who presented to triage for contractions. Upon exam, she was found to be 10 cm and +1 station and was moved to room 239. She began bearing down efforts and pushed well to a spontaneous vaginal birth of a viable female infant at 72.  The head delivered OA and restituted LOT. The shoulders delivered easily with maternal effort.  Nuchal cord was not present.  Infant was placed on the maternal abdomen with a lusty cry.  Infant was dried and placed skin to skin with the mother.  The cord was clamped and cut after pulsations ceased.  Placenta delivered spontaneously and apparently intact upon inspection.  Fundus firmed with massage and IV LR with pitocin running per protocol.  EBL 400 ml.  Inspection of the perineum and vagina revealed a first degree laceration which was repaired in the standard fashion with 3-0 Vicryl Rapide under local anesthesia with 1% lidocaine.  Baby remains skin to skin with the mother.  Dr. Gwenith Spitz notified of the birth.  Mother and baby stable to recovery.    Delivery Summary:  Labor & Delivery Summary  Labor Onset Date: 12/23/16  OB Anesthesia Type: None  Delivery of Placenta Date: 12/23/16 (Simultaneous filing. User may not have seen previous  data.)  Delivery of Placenta Time: 0658  Placenta: Intact (Simultaneous filing. User may not have seen previous data.)  Episiotomy: None (Simultaneous filing. User may not have seen previous data.)  Laceration: First degree perineal  VBAC: No  Complications: None (Simultaneous filing. User may not have seen previous data.)    Specimen: cord blood  Blood Type and Rh: A POS    Rubella Immunity Status:   Lab Results   Component Value Date    RUBELLAIGG Immune 06/01/2016       Britney Crislip, SNM  12/23/2016, 7:35 AM

## 2016-05-09 NOTE — L&D Delivery Note (Signed)
ADDENDUM: Called into room by RN who reported that her fundal evaluation approximately 15 minutes after I left the room resulted in an additional blood loss of including clots.  RN also noted that the uterus was slightly above U and she attempted to have pt void on the bed pan.  Pt was unable to void, however there was approximately 100cc red blood in the bed pan.  RN straight cathed bladder with very little urine return.  I then examined the patient and was able to feel clots at the os.  Manual massage and manual removal of the clots resulted in the fundus being U/-2, firm and pt reported feeling much better.  All chux and pads were weighed for a total of 650cc in addition to the 400cc QBL right after delivery.  Total QBL 1150cc.   At time of patient transfer to PP floor, VS were stable, blood loss is now minimal without clots  Dr. Gwenith Spitz has been informed.

## 2016-05-19 ENCOUNTER — Ambulatory Visit
Admit: 2016-05-19 | Discharge: 2016-05-19 | Payer: PRIVATE HEALTH INSURANCE | Attending: Advanced Practice Midwife | Primary: Family Medicine

## 2016-05-19 DIAGNOSIS — N912 Amenorrhea, unspecified: Secondary | ICD-10-CM

## 2016-05-19 NOTE — Progress Notes (Signed)
Subjective:      Patient ID: Angela Raymond is a 36 y.o. female.    Here for amenorrhea visit.   [redacted] weeks pregnant.         Review of Systems   Constitutional: Positive for fatigue. Negative for activity change and appetite change.   Gastrointestinal: Negative for nausea.   Genitourinary: Positive for menstrual problem.        Amenorrhea and  Breast tenderness. Is having some cramping in lower uterus.    Musculoskeletal: Positive for back pain.        On going lower back pain.    Neurological: Negative for dizziness and headaches.       Objective:   Physical Exam   Constitutional: She is oriented to person, place, and time. She appears well-developed and well-nourished.   Pulmonary/Chest: Effort normal.   Abdominal: Soft.   Neurological: She is alert and oriented to person, place, and time.   Skin: Skin is warm and dry.   Psychiatric: She has a normal mood and affect. Her behavior is normal.       Assessment:      1. Amenorrhea  HCG, Quantitative, Pregnancy    HCG, Quantitative, Pregnancy   2. Needs flu shot  INFLUENZA, QUADV, 3 YRS AND OLDER, IM PF, PREFILL SYR OR SDV, 0.5ML (FLUARIX QUADV, PF)         Plan:      Will get quant hcg today and call patient with results.  If positive will set up for dating ultrasound and NOB visit.  Take prenatal vitamins daily.  Avoid alcohol and non approved medications.  Warning signs reviewed.   Flu shot given today.

## 2016-05-20 ENCOUNTER — Encounter

## 2016-05-20 LAB — HCG, QUANTITATIVE, PREGNANCY: hCG Quant: 85699 m[IU]/mL — AB (ref <3)

## 2016-06-01 ENCOUNTER — Encounter

## 2016-06-01 ENCOUNTER — Other Ambulatory Visit
Admit: 2016-06-01 | Discharge: 2016-06-01 | Payer: PRIVATE HEALTH INSURANCE | Attending: Advanced Practice Midwife | Primary: Family Medicine

## 2016-06-01 ENCOUNTER — Ambulatory Visit: Admit: 2016-06-01 | Discharge: 2016-06-01 | Payer: PRIVATE HEALTH INSURANCE | Primary: Family Medicine

## 2016-06-01 DIAGNOSIS — N83209 Unspecified ovarian cyst, unspecified side: Secondary | ICD-10-CM

## 2016-06-01 DIAGNOSIS — O09899 Supervision of other high risk pregnancies, unspecified trimester: Secondary | ICD-10-CM

## 2016-06-01 NOTE — Progress Notes (Signed)
HISTORY OF PRESENT ILLNESS:  Presents today for initial OB appointment at 743w2d, Patient's last menstrual period was 03/28/2016 (exact date)., dating ultrasound completed and preliminary result reviewed with patient:Normal, see report.     Varicella or vaccinated for varicella as a child:  yes.    Hx of MRSA:  no.    Cats:  no.  If yes, advised to not change litter box.    Desires tubal ligation:  no.    breast  feeding.    Occupation:  Marketing executiveacademic advisor    Discussed genetic screening options including:  Trio Panel, NT scan, and AFP.    Trio Panel:  requested    With gender identification:  requested  NT Testing:  requested  Maternal Serum AFP:  declined    BP 127/72    Pulse 103    Wt 125 lb (56.7 kg)    LMP 03/28/2016 (Exact Date)    BMI 22.86 kg/m??   Body mass index is 22.86 kg/m??..  If BMI is 30 or over, history of gestational diabetes, or insulin resistance then early GCT ordered.    MEDICATIONS:  Current Outpatient Prescriptions   Medication Sig Dispense Refill   ??? Prenatal Vit-Fe Fumarate-FA (PRENATAL VITAMIN PO) Take by mouth       No current facility-administered medications for this visit.        ALLERGIES:  Allergies as of 06/01/2016   ??? (No Known Allergies)       REVIEW OF SYSTEMS:  Positive for fatigue and breast tenderness.  Denies abdominal pain, diarrhea, constipation, and dysuria.  Denies abnormal vaginal discharge, vaginal itching, and odor.  Nausea and/or vomiting:  absent    PHYSICAL EXAM:  GENERAL EXAM Pleasant, well developed, well nourished, well groomed, active.  Non toxic appearance.  No distress.  HEENT:  Eye lids normal, head is normocephalic, no external ear drainage.  PULMONARY/CHEST: good breathing effort, lungs clear to auscultation bilaterally both anterior and posterior.  CARDIAC:  Heart rate is regular rate and rhythm.   NECK: no thyromegaly, no tracheal deviation, supple.  ABDOMEN: soft, non-tender, non-distended.  No rigidity, no rebound tenderness, no guarding, no  organomegaly.  LYMPH NODES: no lymphadenopathy cervical, axillary, or inguinal.   EXTREMITIES/MUSCULOSKELETAL: no edema, clubbing or cyanosis.  Normal range of motion in all extremities.  Normal gait.  NEUROLOGICAL Oriented x 3, no gross motor or sensory deficits noted.  Coordination normal.  SKIN:  Skin is warm, dry and intact. No rash noted. She is not diaphoretic. No erythema.   PSYCHIATRIC:  Normal mood and affect, A&O x3.  Her speech is normal and behavior is normal. Judgment and thought content normal.  EXTERNAL GENITALIA: normal female structures, no labial fusion, no rash, no tenderness, no lesions, no injury to either labia.  VAGINA: normal ruggae, no lesions, normal discharge, no blood, no erythema, no signs of injury to vagina, no foreign body in vagina.  CERVIX: no lesions, no cervical motion tenderness, normal appearance.   VAGINA VAULT: normal. Minimal relaxation  UTERUS: normal mobility, nontender, normal size, shape and consistency.   ADNEXA: normal, non tender no masses  URETHRA: normal, non tender.  BLADDER: non tender.   ANUS/PERINEUM: no hemorrhoids, masses or warts noted.   PELVIC SUPPORT DEFECTS: Normal support of vagina and or uterus.      PLAN:  New OB labs and cultures done today.  Pt given a copy of Trio Panel pamphlet and New OB teaching packet.  NT scan scheduled if patient desires testing.  Weight gain in pregnancy guidelines reviewed according to her BMI.  RTO in 3 week(s) for next appointment.

## 2016-06-02 LAB — CBC WITH AUTO DIFFERENTIAL
Absolute Baso #: 0 10*3/uL (ref 0.0–0.2)
Absolute Eos #: 0.2 10*3/uL (ref 0.0–0.5)
Absolute Lymph #: 2.1 10*3/uL (ref 1.0–4.3)
Absolute Mono #: 0.7 10*3/uL (ref 0.0–0.8)
Absolute Neut #: 7.4 10*3/uL — ABNORMAL HIGH (ref 1.8–7.0)
Basophils: 0.4 % (ref 0.0–2.0)
Eosinophils: 2.3 % (ref 1.0–6.0)
Granulocytes %: 70.9 % (ref 40.0–80.0)
Hematocrit: 40 % (ref 35.0–47.0)
Hemoglobin: 13.6 g/dL (ref 11.7–16.0)
Lymphocyte %: 20.1 % (ref 20.0–40.0)
MCH: 31.6 pg (ref 26.0–34.0)
MCHC: 33.9 % (ref 32.0–36.0)
MCV: 93.1 fL (ref 79.0–98.0)
MPV: 9.2 fL (ref 7.4–10.4)
Monocytes: 6.3 % (ref 2.0–10.0)
Platelets: 247 10*3/uL (ref 140–440)
RBC: 4.29 10*6/uL (ref 3.80–5.20)
RDW: 12.7 % (ref 11.5–14.5)
WBC: 10.4 10*3/uL (ref 3.6–10.7)

## 2016-06-02 LAB — C. TRACHOMATIS / N. GONORRHOEAE, DNA
C. trachomatis DNA: NOT DETECTED
NEISSERIA GONORRHOEAE, DNA: NOT DETECTED

## 2016-06-02 LAB — RUBELLA IMMUNE: Rubella IgG Scr: IMMUNE NA

## 2016-06-02 LAB — HIV SCREEN: HIV 1+2 AB+HIV1P24 AG, EIA: NONREACTIVE NA

## 2016-06-02 LAB — ABO/RH: Rh Type: POSITIVE NA

## 2016-06-02 LAB — ANTIBODY SCREEN: Antibody Screen: NEGATIVE NA

## 2016-06-02 LAB — T4, FREE: T4 Free: 0.98 ng/dL (ref 0.76–1.46)

## 2016-06-02 LAB — HEPATITIS C ANTIBODY: Hepatitis C Ab: NOT DETECTED NA

## 2016-06-02 LAB — HEPATITIS B SURFACE ANTIGEN: Hepatitis B Surface Ag: NOT DETECTED NA

## 2016-06-02 LAB — TSH: TSH: 2.66 uU/mL (ref 0.358–3.740)

## 2016-06-02 LAB — RPR WITH FTA REFLEX: RPR: NONREACTIVE NA

## 2016-06-03 LAB — CULTURE, URINE: Urine Culture, Routine: NORMAL — AB

## 2016-06-22 ENCOUNTER — Ambulatory Visit: Admit: 2016-06-22 | Discharge: 2016-06-22 | Payer: PRIVATE HEALTH INSURANCE | Primary: Family Medicine

## 2016-06-22 ENCOUNTER — Ambulatory Visit
Admit: 2016-06-22 | Discharge: 2016-06-22 | Payer: PRIVATE HEALTH INSURANCE | Attending: Advanced Practice Midwife | Primary: Family Medicine

## 2016-06-22 DIAGNOSIS — Z3491 Encounter for supervision of normal pregnancy, unspecified, first trimester: Secondary | ICD-10-CM

## 2016-06-22 DIAGNOSIS — Z3682 Encounter for antenatal screening for nuchal translucency: Secondary | ICD-10-CM

## 2016-06-22 NOTE — Telephone Encounter (Signed)
Great, thank you.  LN

## 2016-06-22 NOTE — Progress Notes (Signed)
Leuks 1+

## 2016-06-22 NOTE — Telephone Encounter (Signed)
She would like to start 17 P injections, can you please arrange.  Thanks, LN

## 2016-06-22 NOTE — Patient Instructions (Signed)
How to contact us:    During office hours please call the office:   [phone]  After hours answering service phone number:  [phone]  If you call the after hours number, please keep contacting them until you receive a response from a [facility] Provider.      Please call if you experience any of the following:    PRETERM LABOR:  Contractions that you feel 4-6 times per hour.  It may feel like lower abdominal cramping, or low back pain that comes and goes with stomach tightening.  If you notice this, lie down on your side and drink 2 bottles of water.  Rest and fluids is normally enough to calm things down.  If after 1-2 hours the contractions don't get better, then call us.     BLEEDING:   Blood tinged mucous discharge is not uncommon, especially if you have had an exam in the office recently or sexual intercourse.  However if you are bleeding like a period, you should call us    WATER BREAKS:  You may experience in increase in vaginal secretions near the time of labor, but if you think your water broke, please call us.    DECREASED FETAL MOVEMENT:   Baby movements change in the last trimester.  They tend to be not as vigorous, and change to nudges and rolls.  But, if you feel like these movement are not normal, please call our office during office hours or the answering service after hours.

## 2016-06-22 NOTE — Progress Notes (Signed)
Preliminary NT U/S results reviewed today.  NT 1.6 mm, bloodwork pending.  Declined trio panel.  Prenatal lab results reviewed with patient.      Problem list and all histories reviewed.  There are no changes, see pregnancy episode    PE:  No acute distress, alert and oriented  Abdomen nontender, uterus palpable on exam  No edema of extremities    RTO in 4 weeks with cervical length.  LN

## 2016-06-22 NOTE — Telephone Encounter (Signed)
This has already been initiated by Loralie Champagne. The appropriate parties are aware and will start working on it when she turns 13 wks

## 2016-07-01 NOTE — Telephone Encounter (Signed)
Name of Caller: Arvil Persons phone number: [phone]    Relationship to Patient: Nurse case manager, [employer]    Chief Complaint/Reason for Call: Gearldine Bienenstock is calling to make the office aware that they have approved the nurse visits for the pt to receive the Makena injections that have been ordered. Any questions or concerns can be addressed through the phone number above    Best time of day caller can be reached: Any    Did this call require a physician to be paged?: No    If Yes who did you page?     Patient advised that office/PCP has 24-48 business hours to return their call: Yes

## 2016-07-01 NOTE — Telephone Encounter (Signed)
Name of Caller: [facility] phone number: [phone] Ext: [phone]    Relationship to Patient: [facility]    Chief Complaint/Reason for Call: PA need for the Makena  Injection    Best time of day caller can be reached: Anytime    Did this call require a physician to be paged?: No    If Yes who did you page?     Patient advised that office/PCP has 24-48 business hours to return their call: Yes

## 2016-07-04 NOTE — Telephone Encounter (Signed)
PA has already been provider. [facility] has already been notified.

## 2016-07-20 ENCOUNTER — Ambulatory Visit
Admit: 2016-07-20 | Discharge: 2016-07-20 | Payer: PRIVATE HEALTH INSURANCE | Attending: Advanced Practice Midwife | Primary: Family Medicine

## 2016-07-20 ENCOUNTER — Ambulatory Visit: Admit: 2016-07-20 | Discharge: 2016-07-20 | Payer: PRIVATE HEALTH INSURANCE | Primary: Family Medicine

## 2016-07-20 DIAGNOSIS — O09899 Supervision of other high risk pregnancies, unspecified trimester: Secondary | ICD-10-CM

## 2016-07-20 DIAGNOSIS — O09892 Supervision of other high risk pregnancies, second trimester: Secondary | ICD-10-CM

## 2016-07-20 NOTE — Progress Notes (Signed)
Sono for CL: There is a single live fetus in a vertex presentation. Cardiac activity and fetal movement are present.  The transvaginal cervical length = 4.26 cm and shows no change with fundal pressure.  An order should be placed for 2 weeks according to OB Protocol for Hx PTD.    NT results reviewed with patient if she had testing.  Declined AFP today.    Anatomy ultrasound to be scheduled around 18 weeks.      Problem list and all histories reviewed.  There are no changes, see pregnancy episode    PE:  No acute distress, alert and oriented  Abdomen nontender, uterus palpable on exam  No edema of extremities    RTO in 2 weeks with anatomy ultrasound with CL.

## 2016-07-22 NOTE — Telephone Encounter (Signed)
Patient states she was to call in for a z pack if she wasnt feeling better. Patient called off work today and does want the [facility] [address] thank you

## 2016-07-26 MED ORDER — AZITHROMYCIN 250 MG PO TABS
250 MG | PACK | ORAL | 0 refills | Status: AC
Start: 2016-07-26 — End: 2016-08-04

## 2016-08-08 ENCOUNTER — Encounter: Primary: Family Medicine

## 2016-08-08 ENCOUNTER — Encounter: Attending: Obstetrics & Gynecology | Primary: Family Medicine

## 2016-08-08 ENCOUNTER — Ambulatory Visit
Admit: 2016-08-08 | Discharge: 2016-08-08 | Payer: PRIVATE HEALTH INSURANCE | Attending: Advanced Practice Midwife | Primary: Family Medicine

## 2016-08-08 ENCOUNTER — Ambulatory Visit: Admit: 2016-08-08 | Discharge: 2016-08-08 | Payer: PRIVATE HEALTH INSURANCE | Primary: Family Medicine

## 2016-08-08 DIAGNOSIS — Z363 Encounter for antenatal screening for malformations: Secondary | ICD-10-CM

## 2016-08-08 DIAGNOSIS — O09899 Supervision of other high risk pregnancies, unspecified trimester: Secondary | ICD-10-CM

## 2016-08-08 NOTE — Telephone Encounter (Signed)
Pt states she saw TE today and diagnosed with previa. Pt has more questions about her restrictions.  Pt sees a chiropractor q 2 to 3 weeks - can she continue with this? Chiro does some spine adjustments due to her lower back pain and also has been doing some pelvic stretches.  How much can pt lift? Rev'd with pt that usually we recommend no lifting over 20-25 lbs. Pt's daughter weighs 32 lbs and she lifts her occas. Okay for 20-25 lbs?  Can she get a massage?  Pt states she knows she is not to do any rigorous activity. She wants to know how far she is allowed to walk while she is exercising.  Will send to TE to review.

## 2016-08-08 NOTE — Telephone Encounter (Signed)
Pt should try not lifting over 25lbs  No limits to walking at this time  Can get a massage

## 2016-08-08 NOTE — Progress Notes (Signed)
Feeling well.  Pt is feeling FM.  Declined AFP.  Preliminary anatomy ultrasound results reviewed with pt:    Single live IUP, [redacted]w[redacted]d by U/S. This is consistent with the EDC of 01/02/17.  The transvaginal cervical length =  4.4 cm and shows no change with fundal pressure.  Complete previa is present.  Orders should be placed for follow up per OB protocol  weeks.     Fetal anatomy appears normal, as noted above.     Problem list and all histories reviewed.  There are no changes, see pregnancy episode    PE:  No acute distress, alert and oriented  Abdomen nontender, uterus palpable on exam  No edema of extremities    RTO in 4 weeks.  TE

## 2016-08-09 NOTE — Telephone Encounter (Signed)
LM for pt to return our call.

## 2016-08-09 NOTE — Telephone Encounter (Signed)
Patient is aware of all advice

## 2016-08-16 NOTE — Telephone Encounter (Signed)
I spoke to [employer] CNM and she advised that there are no precautions that need to be taken.I let the patient know.

## 2016-08-16 NOTE — Telephone Encounter (Signed)
S-Patient concerned about possible exposure to radioactive dye? Her husband had a nuclear stress test done today and she is asking if she needs to take any precautions?  B-She is [redacted] weeks pregnant.  A-n/a  R-I paged Lorenda Ishihara CNM via [email or web address] states that she may be reached at [phone].  Reason for Disposition  ??? [1] Caller requesting NON-URGENT health information AND [2] PCP's office is the best resource    Protocols used: INFORMATION ONLY CALL-ADULT-AH

## 2016-08-22 ENCOUNTER — Ambulatory Visit: Admit: 2016-08-22 | Discharge: 2016-08-22 | Payer: PRIVATE HEALTH INSURANCE | Primary: Family Medicine

## 2016-08-22 ENCOUNTER — Ambulatory Visit
Admit: 2016-08-22 | Discharge: 2016-08-22 | Payer: PRIVATE HEALTH INSURANCE | Attending: Advanced Practice Midwife | Primary: Family Medicine

## 2016-08-22 DIAGNOSIS — O09899 Supervision of other high risk pregnancies, unspecified trimester: Secondary | ICD-10-CM

## 2016-08-22 NOTE — Progress Notes (Signed)
Feeling well.  Pt is feeling FM.  Declined AFP.  Ultrasound results reviewed with pt:    Single live IUP, [redacted]w[redacted]d by U/S. This is consistent with the EDC of 01/14/17.  The transvaginal cervical length = 4.1 cm and shows no change with fundal pressure.  There is an echogenic focus in the left ventricle of the fetal heart. Otherwise fetal anatomy appears normal, as noted above.   Probable right corpus luteum.      Problem list and all histories reviewed.  There are no changes, see pregnancy episode    PE:  No acute distress, alert and oriented  Abdomen nontender, uterus palpable on exam  No edema of extremities    RTO in 4 weeks.  TE

## 2016-08-23 NOTE — Telephone Encounter (Signed)
S: Pt calling [facility] requesting office appt for progesterone injection that is due tomorrow 4/18  B: states she is active with [facility] but they are unable to give her the injection tomorrow  A: pt states she has the progesterone and will bring it with her to the office tomorrow, she is requesting a nurse to give her the injection in the office  R: appt scheduled for tomorrow at the [facility] office  Reason for Disposition  . Requesting regular office appointment    Protocols used: INFORMATION ONLY CALL-ADULT-AH

## 2016-08-24 ENCOUNTER — Encounter: Attending: Advanced Practice Midwife | Primary: Family Medicine

## 2016-08-26 NOTE — Telephone Encounter (Signed)
Agree LN

## 2016-09-06 NOTE — Telephone Encounter (Signed)
S: OB pt. With cough/congestion  B: Pt is [redacted] weeks pregnant. Respiratory symptoms presented this past Saturday, currently day 4 of symptoms.  A: Pt. Reports a dry cough and nasal congestion. She feels some tightness in chest but when she coughs nothing comes up. Denies fever. Congestion actually improving.   R: Pt. Advised on safe otc medications to use in pregnancy - saline nasal sprays, plain mucinex or robitussin (aware to avoid products with DM - dextromethorphan). Fluids encouraged, advised to follow up with PCP if no improvement after 7 days.  Reason for Disposition  . Cough with no complications    Protocols used: COUGH-ADULT-OH

## 2016-09-08 ENCOUNTER — Ambulatory Visit
Admit: 2016-09-08 | Discharge: 2016-09-08 | Payer: PRIVATE HEALTH INSURANCE | Attending: Advanced Practice Midwife | Primary: Family Medicine

## 2016-09-08 ENCOUNTER — Ambulatory Visit: Admit: 2016-09-08 | Discharge: 2016-09-08 | Payer: PRIVATE HEALTH INSURANCE | Primary: Family Medicine

## 2016-09-08 DIAGNOSIS — Z3A23 23 weeks gestation of pregnancy: Secondary | ICD-10-CM

## 2016-09-08 DIAGNOSIS — O09899 Supervision of other high risk pregnancies, unspecified trimester: Secondary | ICD-10-CM

## 2016-09-08 NOTE — Progress Notes (Signed)
Pt w/o complaints. No LOF/VB, feels mvmt, no HA  Problem list and all histories reviewed.  There are no changes, see pregnancy episode  US today for h/o PTD:  Single live IUP.   The transvaginal cervical length = 4.25 cm and shows no change with fundal pressure.  Complete placenta previa is again noted. Orders should be placed for a follow up and transvaginal U/S at 28 weeks per protocol for placenta previa.   The patient has an appointment with Orlie DakinSarah Janisse Ghan CNM following ultrasound.     Weight gain reviewed, guidelines discussed.  Encouraged daily aerobic activity.   PE:  NAD, alert and oriented  abd gravid,nontender   No edema of extremities    Plan:    ICD-10-CM ICD-9-CM    1. [redacted] weeks gestation of pregnancy Z3A.23 V22.2 US OB Follow Up Transabdominal Approach      Glucose tolerance, 1 hour      CBC Auto Differential   2. Anxiety in pregnancy, antepartum O99.340 648.43     F41.9 300.00    3. H/O preterm delivery, currently pregnant O09.219 V23.41    4. Placenta previa antepartum O44.00 641.13        Pregnancy-Preterm labor precautions have been reviewed, fetal movement reviewed,  Discussed GCT and CBC next visit, instructions given.  Return in about 4 weeks (around 10/06/2016) for Routine OB with CNM, US for complete previa, GCT.  Orlie DakinSarah Sukhman Martine, CNM

## 2016-10-04 ENCOUNTER — Encounter

## 2016-10-06 ENCOUNTER — Ambulatory Visit: Admit: 2016-10-06 | Discharge: 2016-10-06 | Payer: PRIVATE HEALTH INSURANCE | Primary: Family Medicine

## 2016-10-06 ENCOUNTER — Ambulatory Visit
Admit: 2016-10-06 | Discharge: 2016-10-06 | Payer: PRIVATE HEALTH INSURANCE | Attending: Advanced Practice Midwife | Primary: Family Medicine

## 2016-10-06 DIAGNOSIS — O4400 Placenta previa specified as without hemorrhage, unspecified trimester: Secondary | ICD-10-CM

## 2016-10-06 DIAGNOSIS — O4402 Placenta previa specified as without hemorrhage, second trimester: Secondary | ICD-10-CM

## 2016-10-06 NOTE — Progress Notes (Signed)
Sono  Today for F/U on placenta previa: Single live IUP in a vertex presentation. EFW is 1150 gr = 2 lbs 9 oz, 58%. AFI = 15.29 cm.  Fetal anatomy appears normal, as noted above.  The placenta is now low-lying. The tip of the placenta measures 1.02 cm from the internal os. Orders should be placed for a follow up and transvaginal ultrasound in 4 weeks per protocol for low-lying placenta.     GCT and CBC done today.    Patient reports good FM, call with dec FM.    Pt did not receive TDaP.    Reviewed childbirth class offerings.     Problem list and all histories reviewed.  There are no changes, see pregnancy episode    PE:  No acute distress, alert and oriented  Abdomen nontender, uterus palpable on exam  No edema of extremities    RTO in 2 weeks.

## 2016-10-07 LAB — CBC WITH AUTO DIFFERENTIAL
Absolute Baso #: 0 10*3/uL (ref 0.0–0.2)
Absolute Eos #: 0.3 10*3/uL (ref 0.0–0.5)
Absolute Lymph #: 1.4 10*3/uL (ref 1.0–4.3)
Absolute Mono #: 0.4 10*3/uL (ref 0.0–0.8)
Absolute Neut #: 6.9 10*3/uL (ref 1.8–7.0)
Basophils: 0.4 % (ref 0.0–2.0)
Eosinophils: 3.2 % (ref 1.0–6.0)
Granulocytes %: 75.7 % (ref 40.0–80.0)
Hematocrit: 37.6 % (ref 35.0–47.0)
Hemoglobin: 12.5 g/dL (ref 11.7–16.0)
Lymphocyte %: 15.8 % — ABNORMAL LOW (ref 20.0–40.0)
MCH: 31.7 pg (ref 26.0–34.0)
MCHC: 33.4 % (ref 32.0–36.0)
MCV: 94.9 fL (ref 79.0–98.0)
MPV: 9.8 fL (ref 7.4–10.4)
Monocytes: 4.9 % (ref 2.0–10.0)
Platelets: 200 10*3/uL (ref 140–440)
RBC: 3.96 10*6/uL (ref 3.80–5.20)
RDW: 13.6 % (ref 11.5–14.5)
WBC: 9.1 10*3/uL (ref 3.6–10.7)

## 2016-10-07 LAB — GLUCOSE TOLERANCE, 1 HOUR: Gluc Chal: 82 mg/dL (ref <140)

## 2016-10-19 NOTE — Telephone Encounter (Signed)
Pt needs to reschedule u/s and prenatal appt. She is requesting appt's be changed to 7-2 or 7-3 instead of 11-03-16.  Pt's appt's rescheduled as requested.  Pt verbalized understanding of date, time, and location.

## 2016-10-20 ENCOUNTER — Ambulatory Visit
Admit: 2016-10-20 | Discharge: 2016-10-20 | Payer: PRIVATE HEALTH INSURANCE | Attending: Advanced Practice Midwife | Primary: Family Medicine

## 2016-10-20 DIAGNOSIS — O4400 Placenta previa specified as without hemorrhage, unspecified trimester: Secondary | ICD-10-CM

## 2016-10-20 NOTE — Progress Notes (Signed)
Urine dipstick shows negative for all components.

## 2016-10-20 NOTE — Progress Notes (Signed)
She is here for 6246w3d OB visit. I have reviewed her pertinent history, lab results, medications and problem list. See Pregnancy Episode.  Feels well today. Feeling good FM, Denies cramping, leaking or bleeding. GCT done results WNL.   Well appearing, Alert & oriented. Skin warm & dry. Normal range of motion in all extremities. Abdomen gravid soft, nontender. Normal resp effort    Assessment and Plan:    Angela Raymond was seen today for routine prenatal visit.    Diagnoses and all orders for this visit:    Placenta previa antepartum  -     US OB Follow Up Transabdominal Approach; Future    [redacted] weeks gestation of pregnancy        Discussed Childbirth classes, TDAP &  Flu shot. Hospital tour and choosing a pediatrician.   Reviewed S&S of preterm labor, who and when to call.    She does notdesire tubal ligation after the birth.    Continue 17-ohp injections as ordered   Return in about 2 weeks (around 11/03/2016) for ROB, Growth US.

## 2016-10-20 NOTE — Patient Instructions (Signed)
CDC Guidelines    Get the Whooping Cough Vaccine While You Are Pregnant    It is important for women to get the whooping cough vaccine in the third trimester of every pregnancy. Vaccines are the best way to prevent this disease. There are 2 different whooping cough vaccines. Both vaccines combine protection against whooping cough, tetanus and diphtheria, but they are for different age groups:  Tdap: for everyone 11 years and older, including pregnant women   DTaP: for children 2 months through 6 years of age    You need the whooping cough vaccine during each of your pregnancies  The best time to get the shot is your 27th through 36th week of pregnancy.  The Centers for Disease Control and Prevention (CDC) now recommends that pregnant women receive the whooping cough vaccine for adolescents and adults (called Tdap vaccine) during the third trimester of each pregnancy. This replaces the original recommendation that pregnant women get the vaccine only if they had not previously received it.  This recommendation is supported by the American College of Obstetricians and Gynecologists and the American College of Nurse-Midwives.    You should get the whooping cough vaccine while pregnant to pass protection to your baby  Early, short-term protection is critical. Your baby will not get her first whooping cough vaccine until she is 2 months old.  After receiving the whooping cough vaccine, your body will create protective antibodies (proteins produced by the body to fight off diseases) and pass some of them to your baby before birth. These antibodies provide your baby some short-term protection against whooping cough in early life. These antibodies can also protect your baby from some of the more serious complications that come along with whooping cough.  Your protective antibodies are at their highest about 2 weeks after getting the vaccine. So you should get the vaccine late in your pregnancy, preferably during your 27th  through 36th week, to give your baby the most protection when she is born.  The amount of whooping cough antibodies in your body decreases over time. When you get the vaccine during one pregnancy, your antibody levels will not stay high enough to provide enough protection for future pregnancies. It is important for you to get a whooping cough vaccine during each pregnancy so that each of your babies gets the greatest number of protective antibodies from you and the best protection possible against this disease.    Getting the whooping cough vaccine while pregnant is better than getting the vaccine after you give birth  Whooping cough vaccination during pregnancy is ideal so your baby will have short-term protection as soon as he is born. This early protection is important because your baby will not start getting his whooping cough vaccines until he is 2 months old. These first few months of life are when your baby is at greatest risk for catching whooping cough and having severe, potentially life-threating complications from the infection. To avoid that gap in protection, it is best to get a whooping cough vaccine during pregnancy so you pass protection to your baby before he is born. To continue protecting your baby, he should get whooping cough vaccines starting at 2 months old.  About 30 to 40% of babies who get whooping cough catch it from their mother.*  *When source was identified  However, if you have never been vaccinated with Tdap vaccine and you do not get vaccinated during pregnancy, be sure to get the vaccine immediately after you give birth,   before leaving the hospital or birthing center. It will take about 2 weeks before your body develops protection (antibodies) in response to the vaccine. Once you have protection from the vaccine, you are less likely to give whooping cough to your newborn while caring for him. But remember, your baby will still be at risk for catching whooping cough from  others.  Public health professionals expect that having mothers get the whooping cough vaccine during pregnancy will prevent more babies from ending up in the hospital and dying from whooping cough than if mothers get the vaccine after delivery.    Blood tests cannot tell if you need a whooping cough vaccine  There are no blood tests that can tell you if you have enough antibodies in your body to protect yourself or your baby against whooping cough. Even if you have been sick with whooping cough in the past or previously received the vaccine, you still should get the vaccine during each pregnancy.    Breastfeeding may pass some protective antibodies onto your baby  By breastfeeding, you may pass some antibodies you have made in response to the vaccine to your baby. When you get a whooping cough vaccine during your pregnancy, you will have antibodies in your breast milk that you can share with your baby as soon as your milk comes in. However, your baby will not get protective antibodies immediately if you wait to get the whooping cough vaccine until after delivering your baby. This is because it takes about 2 weeks for your body to create antibodies.

## 2016-11-02 ENCOUNTER — Encounter

## 2016-11-03 ENCOUNTER — Encounter: Attending: Advanced Practice Midwife | Primary: Family Medicine

## 2016-11-03 ENCOUNTER — Encounter: Primary: Family Medicine

## 2016-11-07 ENCOUNTER — Encounter: Attending: Advanced Practice Midwife | Primary: Family Medicine

## 2016-11-08 ENCOUNTER — Ambulatory Visit
Admit: 2016-11-08 | Discharge: 2016-11-08 | Payer: PRIVATE HEALTH INSURANCE | Attending: Advanced Practice Midwife | Primary: Family Medicine

## 2016-11-08 ENCOUNTER — Ambulatory Visit: Admit: 2016-11-08 | Discharge: 2016-11-08 | Payer: PRIVATE HEALTH INSURANCE | Primary: Family Medicine

## 2016-11-08 DIAGNOSIS — F419 Anxiety disorder, unspecified: Secondary | ICD-10-CM

## 2016-11-08 DIAGNOSIS — O4440 Low lying placenta NOS or without hemorrhage, unspecified trimester: Secondary | ICD-10-CM

## 2016-11-08 NOTE — Progress Notes (Signed)
Good FM, call with dec FM.  Postpartum birth control options discussed.     [place] today:  Single live IUP in a vertex presentation. EFW is 1974 gr = 4 lbs 6 oz, 49%. AFI = 13.84 cm.  Fetal anatomy appears normal, as noted above.  The previously reported low-lying placenta has resolved.     Problem list and all histories reviewed.  There are no changes, see pregnancy episode    PE:  No acute distress, alert and oriented  Abdomen nontender, uterus palpable on exam  No edema of extremities    RTO in 2 weeks.  TE

## 2016-11-16 NOTE — Telephone Encounter (Signed)
Needs to be auto-injectable, single dose vials not available per pharmacy.

## 2016-11-16 NOTE — Telephone Encounter (Signed)
Last visit 11/08/16 and next visit is 11/21/16. RX pended to provider for approval and send.

## 2016-11-20 MED ORDER — MAKENA 250 MG/ML IM OIL
250 MG/ML | INTRAMUSCULAR | 1 refills | Status: DC
Start: 2016-11-20 — End: 2016-12-06

## 2016-11-20 MED ORDER — HYDROXYPROGESTERONE CAPROATE 250 MG/ML IM OIL
250 MG/ML | INTRAMUSCULAR | 3 refills | Status: DC
Start: 2016-11-20 — End: 2016-12-06

## 2016-11-21 ENCOUNTER — Ambulatory Visit
Admit: 2016-11-21 | Discharge: 2016-11-21 | Payer: PRIVATE HEALTH INSURANCE | Attending: Advanced Practice Midwife | Primary: Family Medicine

## 2016-11-21 DIAGNOSIS — Z3483 Encounter for supervision of other normal pregnancy, third trimester: Secondary | ICD-10-CM

## 2016-11-21 NOTE — Patient Instructions (Addendum)
Immediate Post Partum Options at [facility]    Before you go home from the hospital, it is a good idea to have a plan for contraception.  The [place] mandates that you be offered placement of a Long Activing Reversible Contraception (LARC)  method during your hospital stay.   [facility] will offer you the option of having a LARC placed after your delivery and before you are discharged.   Long active reversible contraceptive choices include the Mirena IUD, Paragard IUD or a hormonal implant called Nexplenon implant.    The following serves as Agricultural engineereducational material so you can better understand your options    A contraceptive is a way to prevent you from becoming pregnant.    Contraception can be:   A practice like abstinence (not having intercourse).   A barrier like a condom or diaphragm to keep sperm from reaching the egg.   Prescription hormones (the pill, shot, patch or vaginal ring) that keep a woman from releasing an egg each month.   A device  Like an intrauterine contraceptive (IUD) that thickens protective mucus and prevents sperm from reaching the egg.  Some IUDs also contain hormones.   Surgery to permanently prevent release of spern (vasectomy) or to prevent sperm from reahing the egg (tubal ligation).    No method of birth control, except abstinence, is 100% effective against pregnancy or contracting sexually trasmitted infection, including the Human Immunodeficiency Virus (HIV)    Intrauterine Contraceptives - 2 choices   Paragard Copper T IUD which lasts for 10 years and contains copper   Mirena IUD which contains a hormone called Levonorgestrel and lasts 5 years.  In addition to contraception, a hormonal IUD may decrease bleeding  And cramping during your menstrual cycle over time  Intrauterine contraceptives are 98-99% effective a preventing pregnancy.  They can be inserted shortly aft the delivery of the baby's placenta.  Risks of IUD   Uterine cramping pushes the  device out of the uterus called "expulsion").  This is more common when the IUD is placed just after delivery (24% versus 10% if placed later).  If this happens you could become pregnant or need the IUD replaced   Puncturing the uterus (called perforation) rarely occurs, but sometimes surgery is needed to remove the IUD   Accidental pregnancy (less than 1%).  If this occurs it could result in an increased risk for ectopic or tubal prenancy    Some women complain of more cramping with the IUD, but this usually decreased over time.  Your doctor or midwife may recommend one device other the other based on your medical history.    Hormonal Implant  Nexplenon Implant lasts for 3 years and contains a hormone called Etonogestrel and is the most reliable non-surgical contraceptive  Nexplanon may also help with painful periods.  The hormonal mplant is 99% effective and even more effective than surgical methods.  The implant is inserted under the skin of your upper arm before you go home.  Risks of Hormonal Implants   May decrease your milk supply when breastfeeding if inserted prior to 4 weeks post partum   May cause irrgular bleeding or spotting, or periods may stop   Hormonal methods of contracetpion may increase your risk of blood clots, especially if you smoke.  Your provider will discuss the best methods based on your medical history.      -----------------------------------------------------------------------------------------------  [facility]oul[facility] in the [place]    [place]   [phone]   [email or web address]    [place]opher [place]   [phone]    [email or web address]    Alba Cory   [phone]   [email or web address]    Luana Shu      [phone]     [email or web address]    Jimmy Picket [phone]      --------------------------------------------------------------------------------------------------------------------  Recommended Literature    [facility] by Lewis Moccasin      --------------------------------------------------------------------------------------------------------------------

## 2016-11-21 NOTE — Progress Notes (Signed)
She is here for 7741w0d OB visit..  I have reviewed her pertinent history, lab results, medications and problem list. See Pregnancy Episode.    Feels well today. Feeling good FM   Denies cramping leaking or bleeding.  Getting ready for baby. Plans breast   Pediatrician chosen:   Yes  Tdap done today  Discussed birth plan, reading suggestions and doulas     PE:  Well appearing, Alert & oriented  Skin warm & dry  Normal range of motion in all extremities.    Abdomen gravid soft  nontender  Normal resp effort    Assessment and Plan:  Angela Raymond was seen today for routine prenatal visit.    Diagnoses and all orders for this visit:    Prenatal care, subsequent pregnancy, third trimester      Discussed post partum birth control options - Infor for Immediate PP LARCS placed on AVS  RTO 2 weeks

## 2016-11-23 ENCOUNTER — Encounter: Attending: Advanced Practice Midwife | Primary: Family Medicine

## 2016-11-30 NOTE — Telephone Encounter (Signed)
Pt is currently in triage being evaluated for vaginal bleeding

## 2016-11-30 NOTE — Telephone Encounter (Signed)
Answer Assessment - Initial Assessment Questions  1. REASON FOR CALL or QUESTION: "What is your reason for calling today?" or "How can I best help you?" or "What question do you have that I can help answer?"      Vaginal bleeding bright red and passing blood clots [facility]    Protocols used: INFORMATION ONLY CALL-ADULT-AH  S: Pt Called to let office know she is going to hospital for Bright red vaginal bleeding and blood clots.  B 35 weeks 01-02-2017  A: Felt gush of warm fluid and found bright red and blood clots. [facility]  R:[facility] Pt encouraged to call back if she has any questions or concerns.

## 2016-11-30 NOTE — Progress Notes (Signed)
Department of Obstetrics and Gynecology  Labor and Delivery Triage Note    CHIEF COMPLAINT: Vaginal bleeding    HISTORY OF PRESENT ILLNESS:    The patient is a 36 y.o. G2P0101 at 4529w2d    OB History     Gravida Para Term Preterm AB Living    2 1   1   1     SAB TAB Ectopic Molar Multiple Live Births              1        Patient presents with a chief complaint as above. Reports an episode of vaginal bleeding 1.5 hrs ago. No continued bleeding. She got up to go to the bathroom and felt a small gush. When she went to the toilet she saw it was dark red blood on her underwear and then additionally had a golf ball sized clot into the toilet. Endorses some cramping, unchanged from prior to this episode. Not feeling any contractions. Endorses good fetal movement. Denies leakage of fluid.    Estimated Due Date: Estimated Date of Delivery: 01/02/17    PAST MEDICAL HISTORY:   Past Medical History:   Diagnosis Date   . Abnormal Pap smear of cervix 2007   . Anxiety 2003    counseling   . Asthma    . Depression 2003    counseling, did have postpartum depression.      PAST SURGICAL HISTORY:   Past Surgical History:   Procedure Laterality Date   . COLONOSCOPY  06/2014    Did have a polyp removed, done for bleeding      SOCIAL HISTORY:   reports that she has never smoked. She has never used smokeless tobacco. She reports that she does not drink alcohol or use drugs.     MEDICATIONS:  Prior to Admission medications    Medication Sig Start Date End Date Taking? Authorizing Provider   MAKENA 250 MG/ML OIL oil injection INJECT 1ML (250 MG) INTRAMUSCULARLY ONCE WEEKLY AS DIRECTED. STORE ATROOM TEMPERATURE. SINGLE DOSE VIAL. 11/20/16   Carollee HerterShannon Crites, APRN - CNM   hydroxyprogesterone caproate 250 MG/ML OIL oil injection Inject 1 mL into the muscle every 7 days Ok to fill with Auto-injectable syringe 11/20/16   Charlsie MerlesShannon Crites, APRN - CNM   Prenatal Vit-Fe Fumarate-FA (PRENATAL VITAMIN PO) Take by mouth    Historical Provider, MD       PRENATAL CARE:  Complicated by:   1. Placenta previa - resolved on US 7/3 (tip of placenta 3.04 cm from internal os)  2. Hx PTD - hx SVD at 264w6d, on Makena  3. Anxiety  4. Asthma - as a child    REVIEW OF SYSTEMS:   Pertinent items are noted in HPI.    APPEARANCE:    Pain: no    PHYSICAL EXAM:  Vital Signs: BP 117/80, vitals reviewed and within normal limits  Abdomen/Uterus: soft, gravid, nontender  Speculum Exam: small amount of dark red blood in vault (about 20 cc, saturated 1 fox swab), slightly friable appearing cervix, no active bleeding from os including with Valsalva  Fetal heart rate: Category I  Cervix: 0/50/-3  Contraction frequency: q7-3610min  Membranes: Intact    GENERAL LABS:    Recent Results (from the past 24 hour(s))   CBC    Collection Time: 11/30/16 10:50 PM   Result Value Ref Range    WBC 10.9 (H) 3.6 - 10.7 10*3/uL    RBC 4.01 3.80 - 5.20 10*6/uL  Hemoglobin 12.0 11.7 - 16.0 g/dL    Hematocrit 16.135.3 09.635.0 - 47.0 %    MCV 87.9 79.0 - 98.0 fL    MCH 29.9 26.0 - 34.0 pg    MCHC 34.0 32.0 - 36.0 %    RDW 13.3 11.5 - 14.5 %    Platelets 195 140 - 440 10*3/uL    MPV 10.3 7.4 - 10.4 fL   Fibrinogen    Collection Time: 11/30/16 10:50 PM   Result Value Ref Range    Fibrinogen 422 (H) 200 - 400 mg/dL   Protime-INR    Collection Time: 11/30/16 10:50 PM   Result Value Ref Range    Protime 9.4 9.0 - 12.0 s    INR 0.9 0.9 - 1.1 NA   APTT    Collection Time: 11/30/16 10:50 PM   Result Value Ref Range    aPTT 23.1 20.0 - 30.5 s       RESULTS:  Rh positive  NST reactive    TRIAGE COURSE: Presents for vaginal bleeding. Reports an episode of dark red bleeding (including golf-ball sized clot) at 8:30 pm. No further bleeding. Speculum exam remarkable for small amount of dark red blood in vault, suspect from friable-appearing cervix. No active bleeding on exam. FHT cat I. Contracting infrequently. Will monitor bleeding clinically for 1 hr, pt ambulating in halls. Very low suspicion for abruption however pt is  concerned as she had a friend who had a bad fetal outcome following abruption. Abruption labs (CBC, coags, fibrinogen) sent. Discussed w/ Dr. Alveta HeimlichKovacevich.   Electronically signed by Reche DixonKatherine Anaija Wissink, MD on 12/01/2016 at 12:07 AM     No bleeding after ambulating for 30 min. Placed back on monitors. CBC unremarkable (Hb 12.0 from 12.5 5/31, Plt 195 from 200 5/31). Fibrinogen and coags pending. Will await these lab results and further fetal/toco monitoring. Anticipate discharge to home if these remain reassuring.   Electronically signed by Reche DixonKatherine Dimonique Bourdeau, MD on 12/01/2016 at 12:07 AM     All labs returned unremarkable. Fibrinogen 422. Still no bleeding since initial episode. FHT remains cat I with infrequent contractions. Discharged to home with strict return precautions.   Electronically signed by Reche DixonKatherine Lesta Limbert, MD on 12/01/2016 at 12:08 AM    IMPRESSION:   Vaginal Bleeding    DISCUSSED WITH [place] PROVIDER: Dr. Alveta HeimlichKovacevich    DISPOSITION: Discharge to Home

## 2016-12-01 ENCOUNTER — Inpatient Hospital Stay: Payer: PRIVATE HEALTH INSURANCE

## 2016-12-01 LAB — CBC
Hematocrit: 35.3 % (ref 35.0–47.0)
Hemoglobin: 12 g/dL (ref 11.7–16.0)
MCH: 29.9 pg (ref 26.0–34.0)
MCHC: 34 % (ref 32.0–36.0)
MCV: 87.9 fL (ref 79.0–98.0)
MPV: 10.3 fL (ref 7.4–10.4)
Platelets: 195 10*3/uL (ref 140–440)
RBC: 4.01 10*6/uL (ref 3.80–5.20)
RDW: 13.3 % (ref 11.5–14.5)
WBC: 10.9 10*3/uL — ABNORMAL HIGH (ref 3.6–10.7)

## 2016-12-01 LAB — APTT: aPTT: 23.1 s (ref 20.0–30.5)

## 2016-12-01 LAB — PROTIME-INR
INR: 0.9 NA (ref 0.9–1.1)
Protime: 9.4 s (ref 9.0–12.0)

## 2016-12-01 LAB — FIBRINOGEN: Fibrinogen: 422 mg/dL — ABNORMAL HIGH (ref 200–400)

## 2016-12-01 NOTE — Discharge Instructions (Signed)
Follow up appointment with your doctor/midwife - Keep next scheduled appointment    Activity - Normal Activity    Call your doctor/midwife if you have:    - leaking fluid  - vaginal bleeding  - regular contractions:  Every 10 minutes or closer for one hour  - decreased fetal movement  - worsening abdominal (belly) pain  - headache, blurry vision, increased swelling, upper abdominal pain

## 2016-12-01 NOTE — Other (Unsigned)
Patient Acct Nbr: [id number]   Primary AUTH/CERT:   Primary Insurance Company Name: [employer] [place]  Primary Insurance Plan name: [facility]  Primary Insurance Group Number: [id number]  Primary Insurance Plan Type: Health  Primary Insurance Policy Number: [id number]

## 2016-12-01 NOTE — Telephone Encounter (Signed)
S     Pt calling to reschedule appt from 12-07-16 to 12-06-16.  B     Pt seen in L&D last night for some vag bldg. Told to f/u next week for eval. Pt is 35 weeks preg.  A     Pt would like to come in for eval on 12-06-16 instead of 12-07-16. Denies any probs with vag bldg now, no LOF, some irreg contras. Pt states she hasn't felt a lot of movement in the past 2 hrs but she is just arriving at home now. Enc pt to increase fluids and do kick counts now. Pt will call back if any further ques or concerns.  R     Appt rescheduled for 12-06-16 with TE at [facility].

## 2016-12-06 ENCOUNTER — Ambulatory Visit
Admit: 2016-12-06 | Discharge: 2016-12-06 | Payer: PRIVATE HEALTH INSURANCE | Attending: Advanced Practice Midwife | Primary: Family Medicine

## 2016-12-06 DIAGNOSIS — O09899 Supervision of other high risk pregnancies, unspecified trimester: Secondary | ICD-10-CM

## 2016-12-06 NOTE — Progress Notes (Signed)
Pt reports good FM, call with dec FM.    PT states she was seen in L&D about a week ago for vaginal bleeding-pt has had no bleeding since then  Urine culture sent  Discussed how to contact providers after hours.    GBS today.    Problem list and all histories reviewed.  There are no changes, see pregnancy episode    PE:  No acute distress, alert and oriented  Abdomen nontender, uterus palpable on exam  No edema of extremities    RTO in 1 week. TE

## 2016-12-06 NOTE — Progress Notes (Signed)
Urine dipstick shows positive for leukocytes.  Micro exam: not done    Urine dip shows 2+ leuks.

## 2016-12-08 LAB — CULTURE, URINE: Urine Culture, Routine: NORMAL

## 2016-12-08 LAB — GROUP B STREP,PCR: Group B Strep Screen PCR: NEGATIVE

## 2016-12-14 ENCOUNTER — Ambulatory Visit
Admit: 2016-12-14 | Discharge: 2016-12-14 | Payer: PRIVATE HEALTH INSURANCE | Attending: Advanced Practice Midwife | Primary: Family Medicine

## 2016-12-14 DIAGNOSIS — Z3493 Encounter for supervision of normal pregnancy, unspecified, third trimester: Secondary | ICD-10-CM

## 2016-12-14 NOTE — Progress Notes (Signed)
Leuks 3+

## 2016-12-14 NOTE — Progress Notes (Signed)
Good FM, call with dec FM.  GBS result reviewed with patient, negative.  When to call office reviewed with patient, phone numbers given.    Feeling well, finished 17 P.  Call schedule reviewed.    Problem list and all histories reviewed.  There are no changes, see pregnancy episode    PE:  No acute distress, alert and oriented  Abdomen nontender, uterus palpable on exam  No edema of extremities    RTO in 1 week.  LN

## 2016-12-14 NOTE — Patient Instructions (Signed)
During office hours please call the office:   [phone]  After hours answering service phone number:  [phone]    If you call the after hours number, please keep contacting them until you receive a response from a [facility] Provider.      Please call if you experience any of the following:    LABOR:  Contractions that you can not walk or talk though, that are 5 minutes apart, lasting a minute and this has been happening for at least an hour    BLEEDING:   Blood tinged mucous discharge is not uncommon, especially if you have had an exam in the office recently or sexual intercourse.  However if you are bleeding like a period, you should call us    WATER BREAKS:  You may experience in increase in vaginal secretions near the time of labor, but if you think your water broke, please call us    DECREASED FETAL MOVEMENT:   Baby movements change in the last trimester.  They tend to be not as vigorous, and change to nudges and rolls.  But, if you feel like these movement are not normal, please call our office during office hours or the answering service after hours.      When you get to [facility] plan to park in the [address].  Drive or take the elevator to [address].  Walk across the bridge to the main hospital, check in with security and they will direct you from there.

## 2016-12-18 NOTE — Telephone Encounter (Signed)
S:  The patient is calling the [facility] about a Tdap test for her family  B:   She is wondering about having her family members get the Tdap prior to holding her baby.  A:   Vaccines are recommended for everyone but not specifically required that she have every family member get this prior to holding the baby in the hospital.  She thought this was a [employer] requirement.   She is also concerned because her father's wife is Cong[place]inese and whether she should allow her to hold and kiss the baby or if immunizations were recommended.  She is also asking about circumcision - all these questions need discussed at the next visit with the provider.  She will do this.    R:  Bring questions to next provider visit.    Reason for Disposition  . Immunization reactions, questions about    Protocols used: IMMUNIZATION REACTIONS-ADULT-[place]

## 2016-12-19 NOTE — Telephone Encounter (Signed)
From: Otis PeakHeather R Plante  To: Bonner Punaeresa Edwards, APRN - CNM  Sent: 12/18/2016 1:48 PM EDT  Subject: Non-Urgent Medical Question    Hi Rosey Batheresa!    Still hanging in at 38 weeks! I forgot to ask 2 things at our last appt, then forgot to ask Vernona RiegerLaura at my appt last week, so I thought I would ask now in case baby comes before I see Shana Wed.     1. When I had Cora 3.5 years ago, it was recommended that immediate family members who would be frequently interacting with baby receive the tDap shot. I know these are good for 10 years. If we will be having new family visitors, should I also request they receive the shot if not up to date?     2. Do you have info/statistics re: circumcision? I know this is a personal decision but am looking for pros/cons and data from your own knowledge/patients. I have read it is becoming less common and not medically necessary.     Thank you!  Rondall AllegraHeather Mcmath

## 2016-12-21 NOTE — Patient Instructions (Signed)
During office hours please call the office:   [phone]  After hours answering service phone number:  [phone]    If you call the after hours number, please keep contacting them until you receive a response from a [facility] Provider.      Please call if you experience any of the following:    LABOR:  Contractions that you can not walk or talk though, that are 5 minutes apart, lasting a minute and this has been happening for at least an hour    BLEEDING:   Blood tinged mucous discharge is not uncommon, especially if you have had an exam in the office recently or sexual intercourse.  However if you are bleeding like a period, you should call us    WATER BREAKS:  You may experience in increase in vaginal secretions near the time of labor, but if you think your water broke, please call us    DECREASED FETAL MOVEMENT:   Baby movements change in the last trimester.  They tend to be not as vigorous, and change to nudges and rolls.  But, if you feel like these movement are not normal, please call our office during office hours or the answering service after hours.      When you get to [facility] plan to park in the [address].  Drive or take the elevator to [address].  Walk across the bridge to the main hospital, check in with security and they will direct you from there.

## 2016-12-21 NOTE — Progress Notes (Signed)
Good FM, call with dec FM.  Denies lof, vb, having some irregular contractions. GBS result reviewed with patient, negative.  When to call office reviewed with patient, phone numbers given.      Problem list and all histories reviewed.  There are no changes, see pregnancy episode    PE:  No acute distress, alert and oriented  Abdomen nontender, uterus palpable on exam  Some edema of lower extremities    RTO in 1 week.

## 2016-12-22 NOTE — Telephone Encounter (Addendum)
S-Patient is calling in with bleeding and abdominal cramping.  B-She is [redacted] weeks pregnant. She was seen in OB Triage on 11/30/16 with a burst of bleeding, golf ball size clot and cramping and was sent home.  A-She is calling in tonight with bright red bleeding and abdominal cramps 2-3/10 similar to menstrual cramps that started 20 minutes ago. She had some red blood on the tissue/toilet and put a pad on. She had an exam yesterday but no spotting or bleeding until now.   R-Go to L&D now. SW Page to Lanney GinsLaura Neal CNM and  She called right back and advised her to monitor the bleeding over the next hour. Patient notified and she states no further bleeding and the cramping has decreased.   Answer Assessment - Initial Assessment Questions  1. ONSET: "When did this bleeding start?"         now  2. DESCRIPTION: "Describe the bleeding that you are having." "How much bleeding is there?"     - SPOTTING: spotting, or pinkish / brownish mucous discharge; does not fill panti-liner or pad     - MILD:  less than 1 pad / hour; less than patient's usual menstrual bleeding    - MODERATE: 1-2 pads / hour; small-medium blood clots (e.g., pea, grape, small coin)     - SEVERE: soaking 2 or more pads/hour for 2 or more hours; bleeding not contained by pads or continuous red blood from vagina; large blood clots (e.g., golf ball, large coin)       mild  3. ABDOMINAL PAIN SEVERITY: If present, ask: "How bad is it?"  (e.g., Scale 1-10; mild, moderate, or severe)    - MILD (1-3): doesn't interfere with normal activities, abdomen soft and not tender to touch     - MODERATE (4-7): interferes with normal activities or awakens from sleep, tender to touch     - SEVERE (8-10): excruciating pain, doubled over, unable to do any normal activities      2-3  4. PREGNANCY: "Do you know how many weeks or months pregnant you are?"       38 weeks   5. EDD: "What date are you expecting to deliver?"      01/02/17  6. FETAL MOVEMENT: "Has the baby's movement  decreased or changed significantly from normal?"      same  7. HEMODYNAMIC STATUS: "Are you weak or feeling lightheaded?" If so, ask: "Can you stand and walk normally?"       no  8. OTHER SYMPTOMS: "What other symptoms are you having with the bleeding?" (e.g., leaking fluid from vagina, contractions)      no    Protocols used: PREGNANCY - VAGINAL BLEEDING GREATER THAN [redacted] WEEKS EGA-ADULT-AH

## 2016-12-23 ENCOUNTER — Inpatient Hospital Stay
Admit: 2016-12-23 | Discharge: 2016-12-25 | Disposition: A | Discharge status: Home / Self Care | Payer: PRIVATE HEALTH INSURANCE | Source: Ambulatory Visit | Attending: Obstetrics & Gynecology | Admitting: Obstetrics & Gynecology

## 2016-12-23 LAB — CBC
Hematocrit: 38.5 % (ref 35.0–47.0)
Hemoglobin: 12.9 g/dL (ref 11.7–16.0)
MCH: 28.8 pg (ref 26.0–34.0)
MCHC: 33.5 % (ref 32.0–36.0)
MCV: 85.9 fL (ref 79.0–98.0)
MPV: 10.2 fL (ref 7.4–10.4)
Platelets: 188 10*3/uL (ref 140–440)
RBC: 4.48 10*6/uL (ref 3.80–5.20)
RDW: 13.9 % (ref 11.5–14.5)
WBC: 11.8 10*3/uL — ABNORMAL HIGH (ref 3.6–10.7)

## 2016-12-23 LAB — L&D BLOOD BANK HOLD

## 2016-12-23 MED ORDER — OXYTOCIN 30 UNITS IN 500 ML INFUSION
30 UNIT/500ML | INTRAVENOUS | Status: DC | PRN
Start: 2016-12-23 — End: 2016-12-25

## 2016-12-23 MED ORDER — TETANUS-DIPHTH-ACELL PERTUSSIS 5-2.5-18.5 LF-MCG/0.5 IM SUSP
INTRAMUSCULAR | Status: DC
Start: 2016-12-23 — End: 2016-12-25

## 2016-12-23 MED ORDER — DOCUSATE SODIUM 100 MG PO CAPS
100 MG | Freq: Two times a day (BID) | ORAL | Status: DC | PRN
Start: 2016-12-23 — End: 2016-12-25
  Administered 2016-12-24 – 2016-12-25 (×4): 100 mg via ORAL

## 2016-12-23 MED ORDER — ONDANSETRON HCL 4 MG/2ML IJ SOLN
4 MG/2ML | Freq: Four times a day (QID) | INTRAMUSCULAR | Status: DC | PRN
Start: 2016-12-23 — End: 2016-12-23

## 2016-12-23 MED ORDER — ACETAMINOPHEN 325 MG PO TABS
325 MG | ORAL | Status: DC | PRN
Start: 2016-12-23 — End: 2016-12-25

## 2016-12-23 MED ORDER — MEASLES, MUMPS & RUBELLA VAC SC INJ
SUBCUTANEOUS | Status: DC
Start: 2016-12-23 — End: 2016-12-25

## 2016-12-23 MED ORDER — LIDOCAINE HCL (PF) 1 % IJ SOLN
1 % | INTRAMUSCULAR | Status: AC
Start: 2016-12-23 — End: 2016-12-23
  Administered 2016-12-23: 11:00:00 30

## 2016-12-23 MED ORDER — BENZOCAINE-MENTHOL 20-0.5 % EX AERO
CUTANEOUS | Status: DC | PRN
Start: 2016-12-23 — End: 2016-12-25

## 2016-12-23 MED ORDER — CHLORHEXIDINE GLUCONATE 2 % EX PADS
2 % | Freq: Every day | CUTANEOUS | Status: DC | PRN
Start: 2016-12-23 — End: 2016-12-23

## 2016-12-23 MED ORDER — SIMETHICONE 80 MG PO CHEW
80 MG | Freq: Four times a day (QID) | ORAL | Status: DC | PRN
Start: 2016-12-23 — End: 2016-12-25

## 2016-12-23 MED ORDER — OXYTOCIN 10 UNIT/ML IJ SOLN
10 UNIT/ML | INTRAMUSCULAR | Status: DC
Start: 2016-12-23 — End: 2016-12-23

## 2016-12-23 MED ORDER — ONDANSETRON HCL 8 MG PO TABS
8 MG | Freq: Three times a day (TID) | ORAL | Status: DC | PRN
Start: 2016-12-23 — End: 2016-12-25

## 2016-12-23 MED ORDER — OXYTOCIN 30 UNITS IN 500 ML INFUSION
30 UNIT/500ML | INTRAVENOUS | Status: AC
Start: 2016-12-23 — End: 2016-12-23
  Administered 2016-12-23: 12:00:00 125 mL/h via INTRAVENOUS

## 2016-12-23 MED ORDER — BENZOCAINE-MENTHOL 20-0.5 % EX AERO
CUTANEOUS | Status: DC | PRN
Start: 2016-12-23 — End: 2016-12-23
  Administered 2016-12-23: 15:00:00 via TOPICAL

## 2016-12-23 MED ORDER — LACTATED RINGERS IV BOLUS
Freq: Once | INTRAVENOUS | Status: DC
Start: 2016-12-23 — End: 2016-12-23

## 2016-12-23 MED ORDER — IBUPROFEN 600 MG PO TABS
600 MG | Freq: Four times a day (QID) | ORAL | Status: DC
Start: 2016-12-23 — End: 2016-12-25
  Administered 2016-12-23 – 2016-12-25 (×8): 600 mg via ORAL

## 2016-12-23 MED ORDER — LACTATED RINGERS IV SOLN
INTRAVENOUS | Status: DC
Start: 2016-12-23 — End: 2016-12-23
  Administered 2016-12-23: 10:00:00 via INTRAVENOUS

## 2016-12-23 MED ORDER — LIDOCAINE HCL (PF) 1 % IJ SOLN
1 % | INTRAMUSCULAR | Status: DC
Start: 2016-12-23 — End: 2016-12-23

## 2016-12-23 MED ORDER — WITCH HAZEL-GLYCERIN EX PADS
CUTANEOUS | Status: DC | PRN
Start: 2016-12-23 — End: 2016-12-25

## 2016-12-23 MED ORDER — ACETAMINOPHEN 325 MG PO TABS
325 MG | Freq: Four times a day (QID) | ORAL | Status: DC | PRN
Start: 2016-12-23 — End: 2016-12-25
  Administered 2016-12-23 (×2): 650 mg via ORAL

## 2016-12-23 MED ORDER — IBUPROFEN 600 MG PO TABS
600 MG | Freq: Once | ORAL | Status: AC
Start: 2016-12-23 — End: 2016-12-23
  Administered 2016-12-23: 12:00:00 600 mg via ORAL

## 2016-12-23 MED ORDER — LANOLIN EX OINT
CUTANEOUS | Status: DC | PRN
Start: 2016-12-23 — End: 2016-12-25
  Administered 2016-12-25: 07:00:00 via TOPICAL

## 2016-12-23 MED ORDER — OXYTOCIN 30 UNITS IN 500 ML INFUSION
30 UNIT/500ML | INTRAVENOUS | Status: AC
Start: 2016-12-23 — End: 2016-12-23
  Administered 2016-12-23: 11:00:00 250 via INTRAVENOUS

## 2016-12-23 MED FILL — OXYTOCIN 30 UNITS IN 500 ML INFUSION: 30 UNIT/500ML | INTRAVENOUS | Qty: 500

## 2016-12-23 MED FILL — DOCUSATE SODIUM 100 MG PO CAPS: 100 MG | ORAL | Qty: 1

## 2016-12-23 MED FILL — ACETAMINOPHEN 325 MG PO TABS: 325 MG | ORAL | Qty: 2

## 2016-12-23 MED FILL — IBUPROFEN 600 MG PO TABS: 600 MG | ORAL | Qty: 1

## 2016-12-23 MED FILL — XYLOCAINE-MPF 1 % IJ SOLN: 1 % | INTRAMUSCULAR | Qty: 30

## 2016-12-23 MED FILL — OXYTOCIN 10 UNIT/ML IJ SOLN: 10 UNIT/ML | INTRAMUSCULAR | Qty: 1

## 2016-12-23 MED FILL — DERMOPLAST 20-0.5 % EX AERO: CUTANEOUS | Qty: 60

## 2016-12-23 MED FILL — A.E.R. WITCH HAZEL EX PADS: CUTANEOUS | Qty: 40

## 2016-12-23 MED FILL — OXYTOCIN 30 UNITS IN 500 ML INFUSION: 30 UNIT/500ML | INTRAVENOUS | Qty: 1000

## 2016-12-23 NOTE — Progress Notes (Signed)
States pain is better, less cramping Husband at bedside

## 2016-12-23 NOTE — Plan of Care (Signed)
Problem: Discharge Planning:  Goal: Discharged to appropriate level of care  Discharged to appropriate level of care  Outcome: Ongoing      Problem: Fluid Volume - Imbalance:  Goal: Absence of postpartum hemorrhage signs and symptoms  Absence of postpartum hemorrhage signs and symptoms  Outcome: Ongoing      Problem: Pain - Acute:  Goal: Pain level will decrease  Pain level will decrease  Outcome: Ongoing

## 2016-12-23 NOTE — H&P (Signed)
Obstetrical History and Physical        CHIEF COMPLAINT:  Active Labor     HISTORY OF PRESENT ILLNESS:      The patient is a 36 y.o. female at [redacted]w[redacted]d.  OB History     Gravida Para Term Preterm AB Living    2 1   1   1     SAB TAB Ectopic Molar Multiple Live Births              1      Patient presents with a chief complaint as above and is being admitted for active phase labor. Patient was complete when arrived into Triage  Denies DFM/VB/LOF/HA/EpigastricPain/Visual changes     Estimated Due Date: Estimated Date of Delivery: 01/02/17    PRENATAL CARE:  Pregnancy Complications:   Anxiety  Asthma- No current inhaler use  PAST OB HISTORY:  OB History     Gravida Para Term Preterm AB Living    2 1   1   1     SAB TAB Ectopic Molar Multiple Live Births              1        Detailed OB History     Past Medical History:        Diagnosis Date   ??? Abnormal Pap smear of cervix 2007   ??? Anxiety 2003    counseling   ??? Asthma    ??? Depression 2003    counseling, did have postpartum depression.      Past Surgical History:        Procedure Laterality Date   ??? COLONOSCOPY  06/2014    Did have a polyp removed, done for bleeding      Allergies:    Patient has no known allergies.  Social History:    Social History     Social History   ??? Marital status: Married     Spouse name: N/A   ??? Number of children: N/A   ??? Years of education: N/A     Occupational History   ??? Not on file.     Social History Main Topics   ??? Smoking status: Never Smoker   ??? Smokeless tobacco: Never Used   ??? Alcohol use No      Comment: non since the pregnancy   ??? Drug use: No   ??? Sexual activity: Yes     Partners: Male      Comment: none     Other Topics Concern   ??? Not on file     Social History Narrative   ??? No narrative on file     Family History:   Family History   Problem Relation Age of Onset   ??? Diabetes Paternal Grandmother    ??? Osteoporosis Maternal Grandmother    ??? Cancer Maternal Grandmother 64        leukemia   ??? Thyroid Cancer Mother    ??? Other Mother          depression and anxiety   ??? Mental Illness Maternal Uncle    ??? Thyroid Cancer Sister 47   ??? Breast Cancer Other      Medications Prior to Admission:  Prescriptions Prior to Admission: Prenatal Vit-Fe Fumarate-FA (PRENATAL VITAMIN PO), Take by mouth    REVIEW OF SYSTEMS:   Const:            Negative  HEENT:  Negative  Resp:   Negative  CVS:  Negative  GI:  Negative  GU:  Negative  MSK:    Negative  Breast: Negative  Skin:  Negative  Heme/Lymph: Negative  Endo:  Negative  Neuro:  Negative  Psych: Negative    PHYSICAL EXAM:  There were no vitals filed for this visit.  General appearance:  awake, alert, cooperative, no apparent distress, and appears stated age  Neurologic:  Awake, alert, oriented to name, place and time.    Lungs:  No increased work of breathing, good air exchange  Abdomen:  Soft, non tender, gravid, consistent with her gestational age  Sterile Speculum Exam:    Membranes:  Ruptured clear fluid   HSV Lesions: not applicable  Cervix: 10/10/1  Contraction frequency: q3 min       Labs:cbc, type and screen hold   Blood Type/Rh:  A positive   Group B Strep:  negative    Fetus:   EFW: 7 lbs by Leopolds     Presentation: vertex by U/S    Bishop Score: 13   0 1 2 3    Position Posterior Mid Anterior -   Consistency Firm Medium Soft -   Effacement 0-30% 40-50% 60-70% 80% or >   Dilation 0cm 1-2cm 3-4cm 5cm or >   Fetal Station -3 -2 -1, 0 +1, +2       ASSESSMENT AND PLAN:    1. Active Labor  Admission: Admit to L&D   FHR:  Category 1  Celestone: not indicated  Pain control plan: desires none  Delivery Plan: expectant SVD  GBS: GBS negative, No indication for GBS prophylaxis  LARC: declines    2. Asthma   - Childhood asthma    - No inhaler use    3. Anxiety    - In talk therapy     Discussed with CNM-Siri Buege , who agrees with plan.    Judyann Munson, DO  12/23/2016, 6:16 AM     I reviewed and agree with the care provided by the resident or student nurse midwife during or immediately following the visit including  the patient's medical history, the findings in the physical exam, patient's diagnosis and treatment plan.   Electronically signed by Nyra Jabs, APRN - CNM on 12/23/2016 at 6:58 AM

## 2016-12-23 NOTE — Progress Notes (Signed)
States doing fine, Had bad cramps earlier, Now better. Positive bonding noted

## 2016-12-23 NOTE — H&P (Signed)
CNM Obstetrical History and Physical        12/23/2016 at 6:34 AM    Patient Name:  Angela Raymond  Patient DOB:  Dec 11, 1980    CHIEF COMPLAINT:  contractions, leakage of amniotic fluid    HISTORY OF PRESENT ILLNESS:      The patient is a 36 y.o. female at [redacted]w[redacted]d.    Patient presents with a chief complaint as above and is being admitted for active phase labor.  Denies decreased fetal movement, vaginal bleeding, leaking of fluid, headache, epigastric pain, and visual changes.  Denies history of genital herpes.    OB HISTORY:  Obstetric History    G2   P1   T0   P1   A0   L1     SAB0   TAB0   Ectopic0   Molar0   Multiple0   Live Births1       # Outcome Date GA Lbr Len/2nd Weight Sex Delivery Anes PTL Lv   2 Current            1 Preterm 06/2013 [redacted]w[redacted]d  5 lb 7 oz (2.466 kg) F Vag-Spont   LIV          ESTIMATED GESTATIONAL AGE:  Gestational Age: [redacted]w[redacted]d  Estimated Date of Delivery: 01/02/17  Estimated due date confirmed by first trimester ultrasound.    PREGNANCY RISK FACTORS:  Patient Active Problem List    Diagnosis Date Noted   ??? Labor and delivery, indication for care 12/23/2016   ??? Third trimester bleeding    ??? Placenta previa antepartum 08/08/2016     Overview Note:     08/08/2016 Complete placenta previa; 11/08/2016  RESOLVED  TE   1.discuss bleeding precautions, pelvic rest   2.Repeat ultrasound at 28 weeks with transvaginal scan  TE  10/06/16: The placenta is now low-lying. The tip of the placenta measures 1.02 cm from the internal os. Orders should be placed for a follow up and transvaginal ultrasound in 4 weeks per protocol for low-lying placenta. SC  11/08/2016 RESOLVED  TE       ??? H/O preterm delivery, currently pregnant 06/01/2016     Overview Note:     06/01/16: H/O PTD at 35/6. Considering 17P. Will likely choose to do. Info sent for set up.   CL 14-16 weeks then Q 2 weeks until 24 weeks per preterm delivery protocol.  SC  06-22-16  Ultrasound scheduled.  Tasked to RNs, pt would like to do 17P.  LN  07/20/16: Planning  to begin 17P at 19 weeks. SC     ??? Anxiety in pregnancy, antepartum 06/01/2016     Overview Note:     06/01/16: In talk therapy. SC     ??? Childhood asthma without complication 06/01/2016     Overview Note:     06/01/16: No IH at this time. SC     ??? FHx: thyroid cancer 06/01/2016     Overview Note:     06/01/16: Mother and sister both diagnosed at young ages. See history. Followed closely by PCP.  Requests TSH and T4 with NOB labs. SC         PAST MEDICAL HISTORY:        Diagnosis Date   ??? Abnormal Pap smear of cervix 2007   ??? Anxiety 2003    counseling   ??? Asthma    ??? Depression 2003    counseling, did have postpartum depression.        PAST SURGICAL  HISTORY:        Procedure Laterality Date   ??? COLONOSCOPY  06/2014    Did have a polyp removed, done for bleeding        ALLERGIES:    Allergies as of 12/23/2016   ??? (No Known Allergies)       SOCIAL HISTORY:    Social History     Social History   ??? Marital status: Married     Spouse name: N/A   ??? Number of children: N/A   ??? Years of education: N/A     Occupational History   ??? Not on file.     Social History Main Topics   ??? Smoking status: Never Smoker   ??? Smokeless tobacco: Never Used   ??? Alcohol use No      Comment: non since the pregnancy   ??? Drug use: No   ??? Sexual activity: Yes     Partners: Male      Comment: none     Other Topics Concern   ??? Not on file     Social History Narrative   ??? No narrative on file       FAMILY HISTORY:   Family History   Problem Relation Age of Onset   ??? Diabetes Paternal Grandmother    ??? Osteoporosis Maternal Grandmother    ??? Cancer Maternal Grandmother 65        leukemia   ??? Thyroid Cancer Mother    ??? Other Mother         depression and anxiety   ??? Mental Illness Maternal Uncle    ??? Thyroid Cancer Sister 2   ??? Breast Cancer Other        MEDICATIONS PRIOR TO ADMISSION:  No current facility-administered medications on file prior to encounter.      Current Outpatient Prescriptions on File Prior to Encounter   Medication Sig Dispense Refill    ??? Prenatal Vit-Fe Fumarate-FA (PRENATAL VITAMIN PO) Take by mouth         REVIEW OF SYSTEMS:   Const:             Negative  HEENT:   Negative  Resp:    Negative  CVS:    Negative  GI:   Negative  GU:   Negative  MSK:     Negative  Breast:  Negative  Skin:   Negative  Heme/Lymph: Negative  Endo:   Negative  Neuro:   Negative  Psych:  Negative    PHYSICAL EXAM:  There were no vitals filed for this visit.    General appearance:  awake, alert, cooperative, no apparent distress, and appears stated age  Neurologic:  Awake, alert, oriented to name, place and time. DTRs 2+   Lungs:  No increased work of breathing, good air exchange  Heart:  Regular rate and rhythm  Abdomen:  Soft, non tender, gravid, consistent with her gestational age  Pelvic:  No visible external HSV Lesions noted.  Membranes:  Ruptured clear fluid  Lower Extremities:  Trace edema and 2+ reflexes.    FHR:      Baseline:  120  Variability:  moderate  Accels:  present  Decels:  early                                                 Contraction  Frequency:  Every 2 to 3 minutes    Membranes:  are Ruptured clear fluid, see triage note    Cervical Exam:   10 cm +2 station    Bishop Score:  8 +   0 1 2 3    Position Posterior Mid Anterior -   Consistency Firm Medium Soft -   Effacement 0-30% 40-50% 60-70% 80% or >   Dilation 0 cm 1-2 cm 3-4 cm 5 cm or >   Fetal Station -3 -2 -1, 0 +1, +2     Fetus:   EFW: 7 lbs 4 oz    Presentation: Vertex by U/S    Labs:   Lab Results   Component Value Date    HGB 12.0 11/30/2016     Lab Results   Component Value Date    HCT 35.3 11/30/2016     A POS    Antibody Screen:    Antibody Screen   Date Value Ref Range Status   06/01/2016 NEG NA Final     Lab Results   Component Value Date    RUBELLAIGG Immune 06/01/2016     No results found for: GBSCX    ASSESSMENT:  Angela Raymond is a 36 y.o. female  G2P0101 At [redacted]w[redacted]d    OB History     Gravida Para Term Preterm AB Living    2 1   1   1     SAB TAB Ectopic Molar Multiple Live Births               1          Labor and delivery, indication for care [O75.9]       No chief complaint on file.         Past Medical History:   Diagnosis Date   ??? Abnormal Pap smear of cervix 2007   ??? Anxiety 2003    counseling   ??? Asthma    ??? Depression 2003    counseling, did have postpartum depression.            Patient Active Problem List    Diagnosis Date Noted   ??? Labor and delivery, indication for care 12/23/2016   ??? Third trimester bleeding    ??? Placenta previa antepartum 08/08/2016     08/08/2016 Complete placenta previa; 11/08/2016  RESOLVED  TE   1.discuss bleeding precautions, pelvic rest   2.Repeat ultrasound at 28 weeks with transvaginal scan  TE  10/06/16: The placenta is now low-lying. The tip of the placenta measures 1.02 cm from the internal os. Orders should be placed for a follow up and transvaginal ultrasound in 4 weeks per protocol for low-lying placenta. SC  11/08/2016 RESOLVED  TE       ??? H/O preterm delivery, currently pregnant 06/01/2016     06/01/16: H/O PTD at 35/6. Considering 17P. Will likely choose to do. Info sent for set up.   CL 14-16 weeks then Q 2 weeks until 24 weeks per preterm delivery protocol.  SC  06-22-16  Ultrasound scheduled.  Tasked to RNs, pt would like to do 17P.  LN  07/20/16: Planning to begin 17P at 19 weeks. SC     ??? Anxiety in pregnancy, antepartum 06/01/2016     06/01/16: In talk therapy. SC     ??? Childhood asthma without complication 06/01/2016     06/01/16: No IH at this time. SC     ??? FHx: thyroid cancer 06/01/2016     06/01/16:  Mother and sister both diagnosed at young ages. See history. Followed closely by PCP.  Requests TSH and T4 with NOB labs. SC         PLAN:  Admission:  Admit to Labor and Delivery  FHR:  Category 2, continuous fetal monitoring, or intermittent with Cat I FHR tracing  Celestone:  not indicated  Diet:  Clear liquid  Activity:  Ad lib  Pain control plan:  desires epidural but 10 cm +2 and pushy  GBS:  GBS negative, No indication for GBS prophylaxis  Delivery  Plan:  Expectant SVB    Risks, benefits, alternatives and possible complications have been discussed in detail with the patient.  Admission, and post admission procedures and expectations were discussed in detail.  All questions were answered.    Orders Placed This Encounter   Procedures   ??? CBC     Standing Status:   Standing     Number of Occurrences:   1   ??? DIET CLEAR LIQUID;     Standing Status:   Standing     Number of Occurrences:   1   ??? Verify informed consent     Standing Status:   Standing     Number of Occurrences:   1   ??? Verify history and physical completed     Standing Status:   Standing     Number of Occurrences:   1   ??? Vital signs per unit routine     Standing Status:   Standing     Number of Occurrences:   1   ??? Continuous external fetal heart rate monitoring     Standing Status:   Standing     Number of Occurrences:   1   ??? External uterine contraction monitoring     Standing Status:   Standing     Number of Occurrences:   1   ??? Notify physician for     Notify physician for:  pulse <= 50 or >= 120,   respiratory rate <= 12 or >= 25,  temperature >= 100.70F (38.0 C),   urine output <= 69ml/hr,   Blood pressures: SBP <= 90 or >= 150, DBP <=50 or >= 100,  abnormal bleeding.     Standing Status:   Standing     Number of Occurrences:   1   ??? Up as tolerated     Standing Status:   Standing     Number of Occurrences:   99999   ??? I/O per unit routine     Every shift if on IV Tocol[employer].     Standing Status:   Standing     Number of Occurrences:   1   ??? Verify vertex presentation     Standing Status:   Standing     Number of Occurrences:   1   ??? Position change     PRN Intra-uterine resuscitation for hypertonus, tachysystole, or non-reassuring fetal status.     Standing Status:   Standing     Number of Occurrences:   1   ??? Discontinue Pitocin     PRN Intra-uterine resuscitation for hypertonus, tachysystole, or non-reassuring fetal status.     Standing Status:   Standing     Number of Occurrences:   1   ???  Notify physician     Upon discontinuation of Pitocin.     Standing Status:   Standing     Number of Occurrences:   1   ???  Increase IV fluids/IV bolus     500cc bolus of LRPRN Intra-uterine resuscitation for hypertonus, tachysystole, or non-reassuring fetal status.     Standing Status:   Standing     Number of Occurrences:   1   ??? Ice pack to perineum     IMMEDIATE POSTPARTUM PRN for comfort.     Standing Status:   Standing     Number of Occurrences:   1   ??? Misc nursing order (specify)     Obtain prenatal labs, verify prenatal labs (blood type, B-strep, Hepatitis, Rubella, HIV), identify infant's physician, kick counts per protocol (for infants >28 wks)     Standing Status:   Standing     Number of Occurrences:   1   ??? Full Code     Standing Status:   Standing     Number of Occurrences:   1   ??? Initiate Oxygen Therapy Protocol     - If patient has any of the following conditions, initiate oxygen therapy: Intra-uterine resuscitation for hypertonus, tachysystole, or non-reassuring fetal status, SpO2 less than 92%, Cyanosis, Chest Pain, Dyspnea, Home oxygen, or Altered level of consciousness    - If oxygen therapy initiated, enter the RT64 Non rebreather mask oxygen order using Per Protocol order mode using the defaulted order parameters and titrate as specified in that order    - If oxygen therapy initiated, notify provider     Standing Status:   Standing     Number of Occurrences:   5   ??? L&D BLOOD BANK HOLD     Standing Status:   Standing     Number of Occurrences:   1   ??? PATIENT STATUS (DIRECT) Inpatient     Standing Status:   Standing     Number of Occurrences:   1     Order Specific Question:   Patient Class     Answer:   Inpatient [101]     Order Specific Question:   REQUIRED: Diagnosis     Answer:   Labor and delivery, indication for care [161096]     Order Specific Question:   Estimated Length of Stay     Answer:   Estimated stay of more than 2 midnights     Order Specific Question:   Future Attending Provider      Answer:   Gunnar Fusi [[id number]]       Discussed with Dr Risa Grill, who agrees with plan of care.    Nyra Jabs, APRN - CNM  12/23/2016, 6:34 AM

## 2016-12-23 NOTE — Telephone Encounter (Signed)
Reason for Disposition  ??? MILD-MODERATE vaginal bleeding (i.e., small to medium clots; like mild menstrual period)    Protocols used: PREGNANCY - VAGINAL BLEEDING GREATER THAN [redacted] WEEKS EGA-ADULT-AH

## 2016-12-23 NOTE — Progress Notes (Signed)
Pericare provided, pads/panties applied. Bleeding minimal at this time with no clots, fundus firm and midline at u-1, pt tolerating fundal checks with minimal discomfort.  Total blood loss discussed with Bobbi, CNM, and pt stable for transfer at this time.  Able to transfer to wc without difficulty and only minimal assistance, no lightheadedness during transfer to wc.  To PP at 0945, baby in arms.  Pt assisted with transfer to toilet and RN stayed with pt while on toilet until tucked into bed.   Report to [facility], Charity fundraiser.

## 2016-12-23 NOTE — Progress Notes (Signed)
Department of Obstetrics and Gynecology  Labor and Delivery Triage Note        CHIEF COMPLAINT:Contractions    HISTORY OF PRESENT ILLNESS:      The patient is a 36 y.o.  [redacted]w[redacted]d.    OB History     Gravida Para Term Preterm AB Living    2 1   1   1     SAB TAB Ectopic Molar Multiple Live Births              1        Patient presents with a chief complaint as above. Presents with contractions. Denies DFM/VB/LOF    Estimated Due Date:  Estimated Date of Delivery: 01/02/17    PAST MEDICAL HISTORY:   Past Medical History:   Diagnosis Date   ??? Abnormal Pap smear of cervix 2007   ??? Anxiety 2003    counseling   ??? Asthma    ??? Depression 2003    counseling, did have postpartum depression.        PAST  SURGICAL HISTORY:   Past Surgical History:   Procedure Laterality Date   ??? COLONOSCOPY  06/2014    Did have a polyp removed, done for bleeding        SOCIAL HISTORY:     reports that she has never smoked. She has never used smokeless tobacco. She reports that she does not drink alcohol or use drugs.     MEDICATIONS:    Prior to Admission medications    Medication Sig Start Date End Date Taking? Authorizing Provider   Prenatal Vit-Fe Fumarate-FA (PRENATAL VITAMIN PO) Take by mouth    Historical Provider, MD        PRENATAL CARE:    Complicated by: asthma    REVIEW OF SYSTEMS:     Pertinent items are noted in HPI.    APPEARANCE:      Pain:  no      PHYSICAL EXAM:      Abdomen/Uterus:  gravid/non-tender    LE Edema: 1+    Fetal heart rate:   120s    Cervix:  10/100/1 per CNM    Contraction frequency:  regular, every 5 minutes    Membranes:  Ruptured clear fluid    TRIAGE COURSE:  Presented with contractions. Complete on exam. Vertex by BSUS. FHT 120s. Admitted for active labor    IMPRESSION:     Active Labor          DISCUSSED WITH [facility] PROVIDER:  Jennette Kettle CNM    DISPOSITION:  Admit to L&D

## 2016-12-24 LAB — CBC WITH AUTO DIFFERENTIAL
Absolute Baso #: 0 10*3/uL (ref 0.0–0.2)
Absolute Eos #: 0 10*3/uL (ref 0.0–0.5)
Absolute Lymph #: 2.3 10*3/uL (ref 1.0–4.3)
Absolute Mono #: 0.6 10*3/uL (ref 0.0–0.8)
Absolute Neut #: 11.4 10*3/uL — ABNORMAL HIGH (ref 1.8–7.0)
Basophils: 0.3 % (ref 0.0–2.0)
Eosinophils: 0.1 % — ABNORMAL LOW (ref 1.0–6.0)
Granulocytes %: 79.2 % (ref 40.0–80.0)
Hematocrit: 26.9 % — ABNORMAL LOW (ref 35.0–47.0)
Hemoglobin: 8.9 g/dL — ABNORMAL LOW (ref 11.7–16.0)
Lymphocyte %: 16.1 % — ABNORMAL LOW (ref 20.0–40.0)
MCH: 28.7 pg (ref 26.0–34.0)
MCHC: 33.1 % (ref 32.0–36.0)
MCV: 86.5 fL (ref 79.0–98.0)
MPV: 9.7 fL (ref 7.4–10.4)
Monocytes: 4.3 % (ref 2.0–10.0)
Platelets: 161 10*3/uL (ref 140–440)
RBC: 3.11 10*6/uL — ABNORMAL LOW (ref 3.80–5.20)
RDW: 14 % (ref 11.5–14.5)
WBC: 14.4 10*3/uL — ABNORMAL HIGH (ref 3.6–10.7)

## 2016-12-24 MED ORDER — FERROUS SULFATE 325 (65 FE) MG PO TABS
325 (65 Fe) MG | Freq: Two times a day (BID) | ORAL | Status: DC
Start: 2016-12-24 — End: 2016-12-25
  Administered 2016-12-24 – 2016-12-25 (×3): 325 mg via ORAL

## 2016-12-24 MED FILL — DOCUSATE SODIUM 100 MG PO CAPS: 100 MG | ORAL | Qty: 1

## 2016-12-24 MED FILL — IBUPROFEN 600 MG PO TABS: 600 MG | ORAL | Qty: 1

## 2016-12-24 MED FILL — LANSINOH/BREASTFEEDING MOTHERS EX OINT: CUTANEOUS | Qty: 7

## 2016-12-24 NOTE — Progress Notes (Signed)
POST PARTUM DAY # 1    Angela Raymond is a 36 y.o. female [id number]    Her pregnancy was complicated by:   Patient Active Problem List   Diagnosis   . H/O preterm delivery, currently pregnant   . Anxiety in pregnancy, antepartum   . Childhood asthma without complication   . FHx: thyroid cancer   . Placenta previa antepartum   . Third trimester bleeding   . Labor and delivery, indication for care   . Normal spontaneous vaginal delivery       No acute events reported overnight. She has no complaints this AM. Her lochia is light. She denies chest pain, shortness of breath, headache, lightheadedness and blurred vision. She is ambulating well. Pain is well controlled. She is voiding without difficulty.    Vital Signs:  Vitals:    12/23/16 0902 12/23/16 0917 12/23/16 1034 12/23/16 2010   BP: 106/66 (!) 142/73 128/83 112/74   Pulse: 158 93 97 91   Resp:   18 16   Temp:   99 F (37.2 C) 98.7 F (37.1 C)   TempSrc:   Temporal Temporal   SpO2:   98% 98%       Physical Exam:  General:  no apparent distress, alert and cooperative  Affect: appropriate  Lungs:  No increased work of breathing, good air exchange, clear to auscultation bilaterally, no crackles or wheezing  Heart:  regular rate and rhythm    Abdomen: abdomen soft, non-distended, non-tender  Fundus: non-tender, normal size, firm, below umbilicus  Extremities:  no calf tenderness, non edematous    Lab:  Lab Results   Component Value Date    HGB 12.9 12/23/2016     Lab Results   Component Value Date    HCT 38.5 12/23/2016     A POS    Antibody Screen:    Antibody Screen   Date Value Ref Range Status   06/01/2016 NEG NA Final     Lab Results   Component Value Date    RUBELLAIGG Immune 06/01/2016       Assessment/Plan:  1. SANDARA NEUKAM is a [id number] PPD # 1 s/p SVD   - Doing well, VSS   - Female infant   - Encourage ambulation  2. Anxiety   - mood stable    - undergoing talk therapy   - no home meds  3. Breast feeding  4. Contraception: per private attending    5. Continue current care        Provider's Name: Gunnar Fusi, MD     Marsa Aris, DO  12/24/2016, 5:11 AM

## 2016-12-24 NOTE — Plan of Care (Signed)
Problem: Discharge Planning:  Goal: Discharged to appropriate level of care  Discharged to appropriate level of care   Outcome: Ongoing      Problem: Fluid Volume - Imbalance:  Goal: Absence of postpartum hemorrhage signs and symptoms  Absence of postpartum hemorrhage signs and symptoms   Outcome: Ongoing      Problem: Pain - Acute:  Goal: Pain level will decrease  Pain level will decrease   Outcome: Ongoing

## 2016-12-24 NOTE — Progress Notes (Signed)
Mom called out crying, states passed clot, silver dollar size in toliet. No active bleeding noted. Back to bed, scant lochia, Fundus firm 15F below Umb. Reassured, Just finished nursing. Family at bedside.

## 2016-12-24 NOTE — Progress Notes (Signed)
.  Nutrition rescreen completed. Chart reviewed. Patient to be monitored and followed by the diet technician.Angela Raymond, DT

## 2016-12-24 NOTE — Plan of Care (Signed)
Problem: Discharge Planning:  Goal: Discharged to appropriate level of care  Discharged to appropriate level of care   Outcome: Ongoing      Problem: Fluid Volume - Imbalance:  Goal: Absence of postpartum hemorrhage signs and symptoms  Absence of postpartum hemorrhage signs and symptoms   Outcome: Met This Shift      Problem: Pain - Acute:  Goal: Pain level will decrease  Pain level will decrease   Outcome: Ongoing

## 2016-12-24 NOTE — Progress Notes (Signed)
Pt is c/o nipple tenderness with latch. I assisted couplet with a deep latch and instructed pt on the use of cross cradle hold with placing the infant on the breast when he is opening his mouth the widest. Pt states that she has a daughter who was born at around 36 weeks and had difficulty with latching and then discovered a lip and tongue tie. Pt had those conditions tended to and finally, bf was stable. 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 Noted the lip with an extended frenum midway down the gumline. Noted nipple rounded pc. Pt notes a less tender latch with a proper opening and deep latch.

## 2016-12-24 NOTE — Progress Notes (Signed)
[facility]  CNM Postpartum Progress Note    Post-partum Day #1    Subjective:  Patient is feeling well.  Pain is well controlled with oral medication if needed.  She reports vaginal bleeding as normal.  Ambulating and tolerating a regular diet.  Baby is feeding well.    Objective:  BP 112/76   Pulse 88   Temp 97.9 F (36.6 C) (Temporal)   Resp 20   LMP 03/28/2016 (Exact Date)   SpO2 100%   Breastfeeding? Unknown    Hgb from 12.9 to 8.9 s/p PPH  Blood type and Rh:  A POS  Rubella:  immune  General exam:  Pleasant, well developed, well nourished, well groomed.   Abdominal exam:  Soft, nontender, nondistended, fundus firm and below umbilicus  Lower extremities:  Trace edema, 2+ reflexes, neg Homan's sign  Vaginal bleeding:  Small amount of lochia, no clots  Neurological:  Oriented x 3, no gross motor or sensory deficits noted.  Psych:  Appropriate mood and affect     Lab Results   Component Value Date    WBC 14.4 (H) 12/24/2016    HGB 8.9 (L) 12/24/2016    HCT 26.9 (L) 12/24/2016    MCV 86.5 12/24/2016    PLT 161 12/24/2016       Assessment and Plan:  Stable postpartum patient  Continue postpartum care  S/p Postpartum hemorrhage - starting iron BID  Encourage ambulation  Advised lactation consult if breastfeeding  If Rh negative and baby is Rh positive, then she got rhogam postpartum  Discharge today:  no  If patient is being discharged today, postpartum warning signs reviewed.  Follow up:  6 weeks, patient to call for appointment.    Orlie Dakin, CNM

## 2016-12-25 MED ORDER — FERROUS SULFATE 325 (65 FE) MG PO TABS
325 (65 Fe) MG | ORAL_TABLET | Freq: Two times a day (BID) | ORAL | 2 refills | Status: DC
Start: 2016-12-25 — End: 2017-08-03

## 2016-12-25 MED ORDER — IBUPROFEN 600 MG PO TABS
600 MG | ORAL_TABLET | Freq: Four times a day (QID) | ORAL | 0 refills | Status: DC
Start: 2016-12-25 — End: 2017-08-03

## 2016-12-25 MED ORDER — ACETAMINOPHEN 325 MG PO TABS
325 MG | ORAL_TABLET | ORAL | 0 refills | Status: DC | PRN
Start: 2016-12-25 — End: 2017-08-03

## 2016-12-25 MED ORDER — DSS 100 MG PO CAPS
100 MG | ORAL_CAPSULE | Freq: Two times a day (BID) | ORAL | 0 refills | Status: DC | PRN
Start: 2016-12-25 — End: 2017-08-03

## 2016-12-25 MED FILL — LANSINOH/BREASTFEEDING MOTHERS EX OINT: CUTANEOUS | Qty: 7

## 2016-12-25 MED FILL — IBUPROFEN 600 MG PO TABS: 600 MG | ORAL | Qty: 1

## 2016-12-25 MED FILL — DOCUSATE SODIUM 100 MG PO CAPS: 100 MG | ORAL | Qty: 1

## 2016-12-25 NOTE — Other (Unsigned)
Patient Acct Nbr: [id number]   Primary AUTH/CERT:   Primary Insurance Company Name: [employer]  Primary Insurance Plan name: [facility] Super[employer]  Primary Insurance Group Number: [id number]  Primary Insurance Plan Type: Health  Primary Insurance Policy Number: [id number]

## 2016-12-25 NOTE — Progress Notes (Addendum)
POST PARTUM DAY # 2    Angela Raymond is a 36 y.o. female G2P1102  This patient was seen & examined today.     Her pregnancy was complicated by:   Patient Active Problem List   Diagnosis   ??? H/O preterm delivery, currently pregnant   ??? Anxiety in pregnancy, antepartum   ??? Childhood asthma without complication   ??? FHx: thyroid cancer   ??? Placenta previa antepartum   ??? Normal spontaneous vaginal delivery   ??? Third-stage postpartum hemorrhage       Today she is doing well without any chief complaint. Her lochia is light. She denies chest pain and shortness of breath. She is ambulating well. She is tolerating solids.     Vital Signs:  Vitals:    12/23/16 2010 12/24/16 0729 12/24/16 1525 12/24/16 2053   BP: 112/74 112/76 103/72 122/63   Pulse: 91 88 95 106   Resp: 16 20 20 18    Temp: 98.7 ??F (37.1 ??C) 97.9 ??F (36.6 ??C) 98.8 ??F (37.1 ??C) 99.9 ??F (37.7 ??C)   TempSrc: Temporal Temporal Temporal Temporal   SpO2: 98% 100% 97% 98%         Physical Exam:  General:  no apparent distress, alert and cooperative  Affect: appropriate  Lungs:  No increased work of breathing, good air exchange, clear to auscultation bilaterally, no crackles or wheezing  Heart:  regular rate and rhythm    Abdomen: abdomen soft, non-distended, non-tender  Fundus: non-tender, normal size, firm, below umbilicus  Extremities:  no calf tenderness, non edematous    Lab:  Lab Results   Component Value Date    HGB 8.9 (L) 12/24/2016     Lab Results   Component Value Date    HCT 26.9 (L) 12/24/2016     A POS    Antibody Screen:    Antibody Screen   Date Value Ref Range Status   06/01/2016 NEG NA Final     Lab Results   Component Value Date    RUBELLAIGG Immune 06/01/2016       Assessment/Plan:  1. Angela Raymond is a [id number] PPD # 2 s/p SVD   - Doing well, VSS   - female infant   - Encourage ambulation  2. Anxiety  - mood stable   - undergoing talk therapy  - no home meds  3. Delayed Postpartum Hemorrhage   - Iron and colace  - hgb 12.9 -> 8.9 8/18  -  asymptomatic this morning   4. Breast feeding   5. Discharge home with standard precautions and return to clinic in 4-6 weeks  6. Contraception: per private attending       Provider's Name: Gunnar Fusi, MD     Simon Rhein, DO  12/25/2016, 6:22 AM

## 2016-12-25 NOTE — Discharge Instructions (Signed)
After Your Delivery (the Postpartum Period): Your Care Instructions    Congratulations on the birth of your baby. Like pregnancy, the newborn period can be a time of excitement, joy, and exhaustion. You may look at your wondrous little baby and feel happy. You may also be overwhelmed by your new sleep hours and new responsibilities. In these first weeks after delivery, try to take good care of yourself. It may take 4 to 6 weeks to feel like yourself again, and possibly longer if you had a Cesarean birth. You will likely feel very tired for several weeks. Your days will be full of ups and downs, but lots of joy as well.    FOLLOW-UP:  Your follow-up care is a key part of your treatment and safety. Follow-up with your OB doctor in 6 weeks weeks or as specified by your physician. Be sure to make and go to all appointments, and call your doctor if you are having problems. It's also a good idea to know your test results and keep a list of the medicines you take.     BLEEDING  Vaginal bleeding will decrease in amount over the next few weeks. Bleeding may pick up and then decrease again around 7-10 days postpartum.  Use pads instead of tampons for the bloody flow that may last as long as 2 weeks.   You will notice that as your activity increases, your flow may increase.    Call your doctor if you are saturating one maxi pad in an hour & passing large clots for 3 hours or more.    ACTIVITY  NO SEXUAL activity for 6 weeks or until advised by your doctor; Nothing in vagina: intercourse, tampons, or douching.  Showering is okay; NO tub baths, swimming, or hot tubs.  Gradually increase your activity.  Resume exercise regimen only after advice by your doctor.  Avoid lifting anything heavier than your baby or a gallon of milk for six weeks.   Avoid driving 1 week for vaginal delivery and 2 weeks for cesarean section, or longer if you are on prescription pain medicine unless otherwise instructed by your doctor.  Rise slowly from  a lying to sitting and then a standing position.  Climb stairs carefully.  Use caution when carrying your baby up and down the stairs.   You may feel tired or have a lack of energy.  You may continue your prenatal vitamin to replenish nutrients post delivery.  Nap when baby naps to catch up on sleep.     EMOTIONS  You may feel moody, sad, teary, & overwhelmed for the first 2 weeks postpartum; however, feelings of post partum depression may occur any time within the first year after delivery. Contact your OB provider if you feel you may be showing signs of postpartum depression, or have thoughts of harming yourself or your infant.   If infant will not stop crying, contact another adult for help or place infant in their crib on their back and take a break.  NEVER shake your infant.      WOUND CARE     For Vaginal Delivery:    Shower daily, and cleanse your perineum (bottom) with mild soap from front to   Back. Use the plastic squirt bottle until bleeding stops each time you use the restroom instead of wiping with toilet paper.    Ease soreness of hemorrhoids and the area between your vagina and rectum with ice compresses or witch hazel pads.    If   used, stitches will dissolve in 4-6 weeks on their own. You may use a sitz bath or soak in a clean tub with drain open and water running for comfort.   Kegel exercises will help restore bladder control. To do these tighten your muscles as if you were stopping your urine flow. Hold for a few seconds and then relax. Do these throughout the day.      For Cesarean Section Delivery:   Keep your incision clean and dry. If you had steri-strips you may remove these once they start falling off.  If you have staples they need to be removed 3-10 days  after delivery. If you have steri-strips, remove after 7 - 10 days.   Do not wear clothing that irritates the incision line. If your incision in in a crease that is not dry, use a hair-dryer to dry the area 3 times a day.     If you  develop fever, shaking chills, redness, swelling, drainage or discharge from your wound, or if your wound looks like it's coming apart call your doctor immediately.      BREAST CARE  If you develop a warm, red, tender area on your breast or develop a fever contact your doctor.     For breastfeeding moms:   If you become engorged, feeding may be more difficult or painful for 1-2 days.   You may find it helpful to hand express some milk so that the infant can latch on more easily or ease soreness with wet, warm washcloths. While breastfeeding, continue to take your prenatal vitamins as directed by your doctor.      For non-breastfeeding moms:   You may apply ice packs to your breasts over you bra for twenty minutes at a time for comfort. Cabbage leaves may be applied to breasts, replace when wilted.   Avoid stimulation to your breasts, when showering allow the water to strike your back not your breasts.  Do not express milk or your body will make more.   Wear a good fitting bra until your milk dries, such as a sports bra.    DIET & CONSTIPATION  Eat a well balanced diet focusing on foods high in fiber and protein such as: whole grain cereals and breads, fruits and vegetables and legumes (eg, beans, lentils)   Drink 8-10 glasses of fluids daily, especially water.   To avoid constipation you may take a mild over-the-counter stool softener (such as colace) as recommended by your doctor.    SWELLING  Try to keep your legs elevated when you are sitting or lying down.  Stay hydrated and take walks.     BABY  Babies sleep safest on their back in a crib without bumpers, blankets or stuffed animals. Do not sleep with your baby in your bed or the couch.  Do not expose baby to smoke, this can increase risks of asthma and sudden infant death syndrome. If you or someone around baby smokes have them change their shirt and wash any facial hair before holding baby. Do not smoke inside the house and change the ventilation filters in  the house before bringing baby home.      WHEN TO CALL THE DOCTOR   Signs of infection, including fever and chills     Increased bleeding: soaking more than one sanitary pad an hour     Wounds that become red, swollen or drain pus     Vaginal discharge that smells foul     New   pain, swelling, or tenderness in your legs     Pain that you can't control with the medications you've been given     Pain, burning, urgency or frequency of urination, or persistent bleeding in the urine     Cough, shortness of breath, or chest pain     Depression, suicidal thoughts, or feelings of harming your baby     Breasts that are hot, red and accompanied by fever     Any cracking or bleeding from the nipple or areola (the dark-colored area of the breast)    In case of an emergency, call 911 immediately.

## 2016-12-25 NOTE — Plan of Care (Signed)
Problem: Pain - Acute:  Goal: Pain level will decrease  Pain level will decrease   Outcome: Met This Shift

## 2016-12-25 NOTE — Progress Notes (Addendum)
CNM Postpartum Progress Note    POSTPARTUM DAY # 2    Angela Raymond is a 36 y.o. female G2P1102  This patient was seen & examined today.     Her pregnancy was complicated by:   Patient Active Problem List   Diagnosis   ??? H/O preterm delivery, currently pregnant   ??? Anxiety in pregnancy, antepartum   ??? Childhood asthma without complication   ??? FHx: thyroid cancer   ??? Placenta previa antepartum   ??? Normal spontaneous vaginal delivery   ??? Third-stage postpartum hemorrhage         Subjective:  Patient is feeling well.  Pain is well controlled with oral medication if needed.  She denies chest pain, shortness of breath, headache, lightheadedness, blurred vision, peripheral edema, palpitations, dry cough and fatigue.  She reports vaginal bleeding as normal.  Ambulating and tolerating a regular diet.  Baby is feeding well.      Vital Signs:  Vitals:    12/24/16 0729 12/24/16 1525 12/24/16 2053 12/25/16 0720   BP: 112/76 103/72 122/63 102/72   Pulse: 88 95 106 85   Resp: 20 20 18 18    Temp: 97.9 ??F (36.6 ??C) 98.8 ??F (37.1 ??C) 99.9 ??F (37.7 ??C) 98.9 ??F (37.2 ??C)   TempSrc: Temporal Temporal Temporal Temporal   SpO2: 100% 97% 98% 97%       Pertinent Data:    Information for the patient's newborn:  Angela, Raymond [[id number]]   female  Birth Weight: 7 lb 1.9 oz (3.23 kg)    Apgars:   Information for the patient's newborn:  Angela, Raymond [[id number]]   One Minute Apgar: 9  Five Minute Apgar: 9      Postpartum course normal.      Blood Type/Rh:  A POS    Antibody Screen:    Antibody Screen   Date Value Ref Range Status   06/01/2016 NEG NA Final     Rubella:    Lab Results   Component Value Date    RUBELLAIGG Immune 06/01/2016     Lab Results   Component Value Date    HGB 8.9 (L) 12/24/2016     Lab Results   Component Value Date    HCT 26.9 (L) 12/24/2016       Physical Exam:  General:  awake, alert, cooperative, appears stated age, and no apparent distress  Affect: appropriate mood  Lungs:  No increased work of  breathing  Heart:  regular rate and rhythm    Abdomen: abdomen soft, non-distended, non-tender  Fundus: non-tender, normal size, firm, below umbilicus  Vaginal bleeding:  small amount of lochia, no clots  Extremities:  no calf tenderness, trace lower extremity edema, neg Homan's sign      Assessment and Plan:  [name] is a G2P1102 stable on PPD # 2 s/p vaginal birth  ?? Doing well, vital signs stable  ?? female infant rooming in with mom, declines circumcision  ?? Breastfeeding, lactation consult as needed   ?? If Rh negative and baby is Rh positive, then she received rhogam postpartum  ?? Encouraged ambulation  ?? Contraception: oral progesterone-only contraceptive after 6 week visit, per private attending  ?? If patient is being discharged today, postpartum warning signs reviewed  ?? Discharge home with standard precautions and return to clinic in 4-6 weeks  ?? Continue Iron BID      Tillie Fantasia, APRN - CNM  12/25/2016, 9:37 AM

## 2016-12-25 NOTE — Discharge Summary (Addendum)
[facility] Discharge Summary    Patient Name: Angela Raymond  Patient DOB: [birth year]  Room/Bed: 1349/134901  Primary Care Physician: Rosita Kea Factor  Admit Date: 12/23/2016  6:14 AM    Admitting Diagnosis  IUP [redacted]w[redacted]d  OB History     Gravida Para Term Preterm AB Living    2 2 1 1   2     SAB TAB Ectopic Molar Multiple Live Births              2        Patient Active Problem List   Diagnosis   . H/O preterm delivery, currently pregnant   . Anxiety in pregnancy, antepartum   . Childhood asthma without complication   . FHx: thyroid cancer   . Placenta previa antepartum   . Normal spontaneous vaginal delivery   . Third-stage postpartum hemorrhage         Reasons for Admission on 12/23/2016  6:14 AM  Labor and delivery, indication for care [O75.9]  No comment available  Onset of Labor      Intrapartum Procedures                 Spontaneous Vaginal Delivery: See Labor and Delivery Summary       Postpartum Procedures  None    Postpartum/Operative Complications  Third stage postpartum hemorrhage    Newborn Data  Information for the patient's newborn:  [name], [name] [[id number]]   female  Birth Weight: 7 lb 1.9 oz (3.23 kg)    Discharge With Mother  Complications: No    Discharge Diagnosis  Spontaneous vaginal delivery  Postpartum hemorrhage       Discharge Information/Patient Instructions:   [name], [name]   Home Medication Instructions VWU:[id number]    Printed on:12/25/16 1020   Medication Information                      acetaminophen (TYLENOL) 325 MG tablet  Take 2 tablets by mouth every 4 hours as needed for Pain             docusate sodium (COLACE, DULCOLAX) 100 MG CAPS  Take 100 mg by mouth 2 times daily as needed for Constipation             ferrous sulfate 325 (65 Fe) MG tablet  Take 1 tablet by mouth 2 times daily             ibuprofen (ADVIL;MOTRIN) 600 MG tablet  Take 1 tablet by mouth every 6 hours             Prenatal Vit-Fe Fumarate-FA (PRENATAL VITAMIN PO)  Take by mouth                 No discharge procedures  on file.    Activity: activity as tolerated and no sex for 6 weeks  Diet: regular diet  Wound Care: none needed      Discharge to: Home in stable condition  Follow up in 4-6 weeks     Discharge Date: 12/25/2016      Tillie Fantasia, APRN - CNM    Comments:   Home care, Follow-up care and birth control were reviewed.  Signs and symptoms of mastitis and Post Partum Depression were reviewed. Iron BID.   The patient is to notify her physician if any of these occur.     Addendum: To add diagnosis of anemia d/t acute blood loss.   Tillie Fantasia

## 2016-12-27 NOTE — Progress Notes (Signed)
Patient called in requesting a breast pump for return to work.  Gave her [employer] and [employer] information.  Also suggested she review return to work information in center of taking care of self and baby booklet. She verbalizied understanding.

## 2016-12-27 NOTE — Telephone Encounter (Signed)
Name of Caller: Tax inspector phone number: verified above    Relationship to Patient: patient    Chief Complaint/Reason for Call: Pt states her Employer's HR dept faxed a letter over regarding adding her son to there insurance benefits. Pt was checking to see if the fax has been received, completed and faxed back to them. Please contact the pt and advise.    Best time of day caller can be reached: Any      Patient advised that office/PCP has 24-48 business hours to return their call: Yes

## 2016-12-28 NOTE — Telephone Encounter (Signed)
S: Pt is calling [facility] with complaint of skin discoloration and intermittent chills  B:pt had vaginal delivery on 8/17.   A:Pt states that she notes that her skin appears to be yellow. Pt has chills. She has checked her temp and it is WNL. Pt has been taking ibuprofen for discomfort. She denies having increase in pain, vaginal discharge, unpleasant odor. Pt is nursing her baby. She is drinking fluids. Pt states that she does not feel well. Vaginal bleeding is decreasing.   R: I phoned the office and spoke to Laurice. She sent message to L Neal. Pt is to be seen tomorrow in the office. appt scheduled. Pt was advised to stop taking the motrin this evening and monitor her temp. She is to call back if temp is elevated. Pt is to call if symptoms worsen.   Reason for Disposition  . Patient wants to be seen    Protocols used: POSTPARTUM - VAGINAL BLEEDING AND LOCHIA-ADULT-[place]

## 2016-12-29 ENCOUNTER — Encounter

## 2016-12-29 ENCOUNTER — Encounter: Attending: Advanced Practice Midwife | Primary: Family Medicine

## 2016-12-29 ENCOUNTER — Other Ambulatory Visit
Admit: 2016-12-29 | Discharge: 2016-12-29 | Payer: PRIVATE HEALTH INSURANCE | Attending: Obstetrics & Gynecology | Primary: Family Medicine

## 2016-12-29 DIAGNOSIS — R5383 Other fatigue: Secondary | ICD-10-CM

## 2016-12-29 LAB — CBC
Hematocrit: 33.9 % — ABNORMAL LOW (ref 35.0–47.0)
Hemoglobin: 11 g/dL — ABNORMAL LOW (ref 11.7–16.0)
MCH: 29.5 pg (ref 26.0–34.0)
MCHC: 32.5 % (ref 32.0–36.0)
MCV: 90.7 fL (ref 79.0–98.0)
MPV: 8.6 fL (ref 7.4–10.4)
Platelets: 354 10*3/uL (ref 140–440)
RBC: 3.73 10*6/uL — ABNORMAL LOW (ref 3.80–5.20)
RDW: 14.6 % — ABNORMAL HIGH (ref 11.5–14.5)
WBC: 12.1 10*3/uL — ABNORMAL HIGH (ref 3.6–10.7)

## 2016-12-29 NOTE — Progress Notes (Signed)
Subjective:      Patient ID: Angela Raymond is a 36 y.o. female.    BP 139/79   Pulse 134   Ht 5\' 2"  (1.575 m)   Wt 136 lb (61.7 kg)   LMP 03/28/2016 (Exact Date)   Breastfeeding? Yes   BMI 24.87 kg/m     HPI     C/o weakness - sx for past 6 days since delivery    Had chills 3 days ago, none since.    Was using motrin but none in past 24 hours   No fevers, temp 99  Also c/o "skin turning yellow and waxy".  Sx improving   tol po, mild nausea but no emesis - sx worse w/ po fe  Nl lochia.  Nl cramping but no other plv or abd pain.    Nl flatus and bm.    VD 6 days ago, d/c'd home 4 days ago   Baby doing well, breastfeeding   Mom is staying with her and helping.  Sx getting better, getting more rest w/ help   Hb was 8.9 on PPD #1   Also wants tear checked - worried stitches are falling out   Had pp depression w/ last baby - does not feel like these sx are same     Review of Systems   Constitutional: Positive for chills and fatigue. Negative for fever.   Gastrointestinal: Positive for nausea. Negative for constipation, diarrhea and vomiting.   Genitourinary: Positive for vaginal bleeding. Negative for dysuria, hematuria and pelvic pain.   Musculoskeletal: Positive for back pain.   Neurological: Positive for weakness. Negative for syncope.     Past Medical History:   Diagnosis Date   . Abnormal Pap smear of cervix 2007   . Anxiety 2003    counseling   . Asthma    . Depression 2003    counseling, did have postpartum depression.        Past Surgical History:   Procedure Laterality Date   . COLONOSCOPY  06/2014    Did have a polyp removed, done for bleeding        Current Outpatient Prescriptions on File Prior to Visit   Medication Sig Dispense Refill   . acetaminophen (TYLENOL) 325 MG tablet Take 2 tablets by mouth every 4 hours as needed for Pain 30 tablet 0   . ibuprofen (ADVIL;MOTRIN) 600 MG tablet Take 1 tablet by mouth every 6 hours 30 tablet 0   . ferrous sulfate 325 (65 Fe) MG tablet Take 1 tablet by mouth 2  times daily 30 tablet 2   . docusate sodium (COLACE, DULCOLAX) 100 MG CAPS Take 100 mg by mouth 2 times daily as needed for Constipation 30 capsule 0   . Prenatal Vit-Fe Fumarate-FA (PRENATAL VITAMIN PO) Take by mouth       No current facility-administered medications on file prior to visit.        No Known Allergies    Family History   Problem Relation Age of Onset   . Diabetes Paternal Grandmother    . Osteoporosis Maternal Grandmother    . Cancer Maternal Grandmother 70        leukemia   . Thyroid Cancer Mother    . Other Mother         depression and anxiety   . Mental Illness Maternal Uncle    . Thyroid Cancer Sister 72   . Breast Cancer Other        Social History  Social History   . Marital status: Married     Spouse name: N/A   . Number of children: N/A   . Years of education: N/A     Occupational History   . Not on file.     Social History Main Topics   . Smoking status: Never Smoker   . Smokeless tobacco: Never Used   . Alcohol use No      Comment: non since the pregnancy   . Drug use: No   . Sexual activity: Yes     Partners: Male      Comment: none     Other Topics Concern   . Not on file     Social History Narrative   . No narrative on file       Objective:   Physical Exam   Constitutional: She is oriented to person, place, and time. She appears well-developed and well-nourished.   Cardiovascular: Normal rate and regular rhythm.    Pulmonary/Chest: Effort normal and breath sounds normal. No respiratory distress. She has no wheezes.   Abdominal: Soft. She exhibits no distension. There is no hepatosplenomegaly. There is no tenderness. There is no rebound, no guarding and no CVA tenderness.   Genitourinary: There is no rash, tenderness, lesion or injury on the right labia. There is no rash, tenderness, lesion or injury on the left labia. There is tenderness (lac w/ mild appropriate tenderness ) and bleeding (nl lochia ) in the vagina. There are signs of injury (lac healing nl, no signs infxn) in the  vagina.   Musculoskeletal: Normal range of motion. She exhibits no edema.   Nl rom all 4 extremities   Neurological: She is alert and oriented to person, place, and time. No cranial nerve deficit. She exhibits normal muscle tone. Coordination normal.   Skin: Skin is warm and dry. No rash noted. No erythema.   Psychiatric: She has a normal mood and affect. Her behavior is normal.       Assessment:       Diagnosis Orders   1. Fatigue, unspecified type  CBC    Comprehensive Metabolic Panel   2. Weakness     3. Iron deficiency anemia, unspecified iron deficiency anemia type           Plan:      Suspect sx are combination fatigue and anemia s/p VD  Increase rest, fluids  Will check cbc, cmp  Cont po fe.  Call if sx do not improve  Keep pp check 4 wks         Gunnar Fusi, MD

## 2016-12-30 LAB — COMPREHENSIVE METABOLIC PANEL
ALT: 43 U/L (ref 13–69)
AST: 29 U/L (ref 15–46)
Albumin,Serum: 3.7 g/dL (ref 3.5–5.0)
Alkaline Phosphatase: 95 U/L (ref 38–126)
Anion Gap: 8 NA
BUN: 11 mg/dL (ref 7–20)
CO2: 28 mmol/L (ref 22–30)
Calcium: 9.5 mg/dL (ref 8.4–10.4)
Chloride: 104 mmol/L (ref 98–107)
Creatinine: 0.62 mg/dL (ref 0.52–1.25)
EGFR IF NonAfrican American: >60 mL/min (ref >60)
Glucose: 79 mg/dL (ref 70–100)
Potassium: 4 mmol/L (ref 3.5–5.1)
Sodium: 139 mmol/L (ref 137–145)
Total Bilirubin: 0.4 mg/dL (ref 0.2–1.3)
Total Protein: 6.3 g/dL (ref 6.3–8.2)
eGFR African American: >60 mL/min (ref >60)

## 2017-01-31 ENCOUNTER — Other Ambulatory Visit
Admit: 2017-01-31 | Discharge: 2017-01-31 | Payer: PRIVATE HEALTH INSURANCE | Attending: Advanced Practice Midwife | Primary: Family Medicine

## 2017-01-31 MED ORDER — SERTRALINE HCL 50 MG PO TABS
50 MG | ORAL_TABLET | Freq: Every day | ORAL | 5 refills | Status: AC
Start: 2017-01-31 — End: ?

## 2017-01-31 MED ORDER — NORETHINDRONE 0.35 MG PO TABS
0.35 MG | ORAL_TABLET | Freq: Every day | ORAL | 12 refills | Status: DC
Start: 2017-01-31 — End: 2017-08-03

## 2017-01-31 NOTE — Progress Notes (Signed)
POSTPARTUM VISIT    Angela Raymond is a 36 y.o. female    Presents today for routine postpartum follow up visit.      SUBJECTIVE:     Patient had a spontaneous vaginal.  Had PPH after delivery.  Fatigue and weakness has improved.  Iron BID for 5 weeks.  Will now DC.  Denies problems with voiding, BM's, eating or drinking.    Patient is breast feeding.   Patient's bleeding is: no bleeding.     Patient is not sexually active.   Patient is using no method for contraception.  Postpartum depression:  Present.  She feels like it has returned like with first baby, but more manageable this time.  Has made an appointment with her counselor.  Open to restarting zoloft.  Denies suicidal ideation/HI.      REVIEW OF SYSTEMS:     General ROS: negative for - fatigue, weight gain or weight loss  Psychological ROS: negative for - anxiety or depression  Breast ROS: negative for - new or changing breast lumps, nipple changes or nipple discharge.  Respiratory ROS: no cough, shortness of breath, or wheezing  Cardiovascular ROS: no chest pain or dyspnea on exertion  Gastrointestinal ROS: negative for - abdominal pain, blood in stools, change in bowel habits, constipation, diarrhea or gas/bloating  Genito-Urinary ROS: negative for - dysuria, vaginal odor, vaginal discharge, vaginal itching. Positive for bleeding      PHYSICAL EXAM:    BP 99/62   Pulse 85   Wt 131 lb (59.4 kg)   LMP 03/28/2016 (Exact Date)   Breastfeeding? Yes   BMI 23.96 kg/m   Body mass index is 23.96 kg/m.    Edinburgh Postnatal Depression Scale Score:    In the past 7 days:  I have been able to laugh and see the funny side of things: As much as I always could  I have looked forward with enjoyment to things: As much as I ever did  I have blamed myself unnecessarily when things went wrong: Yes, some of the time  I have been anxious or worried for no good reason: Yes, sometimes  I have felt scared or panicky for no good reason: No, not much  I haven't been able to  cope lately: Yes, sometimes I haven't been coping as well as usual  I have been so unhappy that I have had difficulty sleeping: Not at all  I have felt sad or miserable: Yes, quite often  I have been so unhappy that I have been crying: Only occasionally  The thought of harming myself has occurred to me: Never  Total: 10     MENTAL STATUS EXAM:  alert, orient to time, person and place. Normal thought content, speech, affect, mood and dress are noted.    BREAST: no masses noted, no significant tenderness, nipples intact.    ABDOMEN: nontender and no masses, no organomegaly    PELVIC EXAM:    VULVA: normal appearing vulva with no masses, tenderness, or lesions     VAGINA: normal appearing vaginal introitus with normal color and discharge, no lesions visible externally.    CERVIX:  nontender, no cervical motion tenderness, no palpable abnormalities     UTERUS: uterus is normal size, shape, consistency and nontender    ADNEXA: normal adnexa, nontender and no masses,       ASSESSMENT:    1. Postpartum follow-up    2. Encounter for other general counseling or advice on contraception  3. Postpartum depression        PLAN:    Discussed resumption of intercourse and exercise  Discussed birth control options including:  Oral Contraceptive, Mirena, Paragard, Nexplanon, DepoProvera, and condoms/spermicide.  Reviewed risks, benefits, and efficacy of each.  Pt has decided to use oral progesterone-only contraceptive and condoms.  Rx sent for zoloft 50 mg, will take 25 mg daily for a week then increase to 50 mg.  She will call with any issues or SI/HI.  Continue counseling.  Will call for appt sooner if no relief with zoloft.      Follow up: 5 months for annual exam    Nyra Jabs, CNM

## 2017-05-30 NOTE — Telephone Encounter (Signed)
S: Pt calling [facility] with question regarding Lice treatment and breast feeding  B: she has a 335 month old that is breast and bottle feeding  A: her daughter came home with lice and she was treated, mom treated herself over the weekend and was wanting to know if if safe to use while BF. It is a shampoo that she used. BF women may be treated with pyrethrins and piperonyl butoxide.   R: pt then wanted to know if she can have a referral for PT for low back pain and pain during intercourse since delivery. Please advise. She is going to call a few PT places near her also. [facility] in [place] and [facility] in [place] to make sure they accept her insurance.     Reason for Disposition  . Maternal OTC or prescription medications, questions about    Protocols used: BREASTFEEDING - MOTHER'S MEDICINES AND DIET-PEDIATRIC-[place]

## 2017-06-02 ENCOUNTER — Encounter

## 2017-06-02 NOTE — Telephone Encounter (Signed)
PT referral placed for low back pain.  For pain with intercourse she should be seen in the office if it is significant.  Thanks, LN

## 2017-06-05 NOTE — Telephone Encounter (Signed)
LM for pt to return our call. Referral has been faxed. Please have the pt call [phone] to f/u on the referral .

## 2017-06-06 NOTE — Telephone Encounter (Signed)
Message released to patient as written.  Patient states no further questions or concerns.     All questions from office were addressed or relayed to patient from encounter yes

## 2017-06-06 NOTE — Telephone Encounter (Signed)
LM for pt to return our call.

## 2017-07-13 ENCOUNTER — Encounter: Attending: Advanced Practice Midwife | Primary: Family Medicine

## 2017-07-19 NOTE — Telephone Encounter (Signed)
S: patient is calling [facility] today because she has a question on colonoscopy and breast feeding  B: April 8 colonoscopy; she is 6 1/2 months post- partum  A: She is wondering if she needs to pump and dump after her procedure, as well as if the prep is ok for the baby?  R: Spoke to [employer]- states the prep may give the baby some diarrhea and to watch for any dehydration, but it shouldn't be an issue. She is also advised that the procedure shouldn't cause any issues with her breast feeding, however she could call the pediatrician to see if they have any suggestions/ thoughts, and to pump and dump if she is worried about it transferring. She verbalized understanding.     Reason for Disposition  . Maternal OTC or prescription medications, questions about    Protocols used: BREASTFEEDING - MOTHER'S MEDICINES AND DIET-PEDIATRIC-OH

## 2017-07-19 NOTE — Telephone Encounter (Signed)
I advised pt that to my knowledge, only the [facility] location at [facility], and the [place] locations offer Pelvic Floor Therapy. Gave pt the phone # for the [facility] location at [facility].  Advised pt to set up appt there, but I would double-check with the [place] location since they were not previously on my list. I will let her know if they offer Pelvic Floor Therapy. Pt agreed to schedule at [facility] location at [facility] for now.

## 2017-07-19 NOTE — Telephone Encounter (Signed)
Name of Caller: Angela Raymond phone number: [phone]    Relationship to Patient: patient    Chief Complaint/Reason for Call: Pt is calling about her referral for pelvic floor therapy. Pt has been trying to schedule with the [place] location with no success and would like to try the [facility] location. Pt will contact the [facility] location to try and schedule, but is requesting that the referral that was sent to [facility] be sent to [facility] instead.    Best time of day caller can be reached: Any       Patient advised that office/PCP has 24-48 business hours to return their call: Yes

## 2017-08-03 ENCOUNTER — Ambulatory Visit
Admit: 2017-08-03 | Discharge: 2017-08-03 | Payer: PRIVATE HEALTH INSURANCE | Attending: Obstetrics & Gynecology | Primary: Family Medicine

## 2017-08-03 DIAGNOSIS — Z01419 Encounter for gynecological examination (general) (routine) without abnormal findings: Secondary | ICD-10-CM

## 2017-08-03 NOTE — Progress Notes (Signed)
Angela Raymond  08/03/2017              37 y.o.            Primary Care Physician: Rosita Kea Factor  Chief Complaint   Patient presents with   . Annual Exam     HPI : Angela Raymond is a 37 y.o. female here for annual exam.  ____________________________________________________________________  Gynecologic History:    Patient's last menstrual period was 07/14/2017.  Menses are regular. Menses occur every regular every 28-30 days.  Flow is heavy.  Intermenstrual bleeding: No  Dysmenorrhea:Yes: mild    Sexually Active: Yes    STD History:No    Reversible Birth Control:Yes: condoms    HPV vaccination completed: No        Preventative Health Testing:  Date of Last Pap Smear: 2016  Abnormal Pap Smear History: yes in 2007  Colposcopy History:   Colonoscopy : scheduled in 2 weeks due to history of polyps  OB History   Gravida Para Term Preterm AB Living   2 2 1 1  0 2   SAB TAB Ectopic Molar Multiple Live Births   0 0 0 0 0 2      # Outcome Date GA Lbr Len/2nd Weight Sex Delivery Anes PTL Lv   2 Term 12/23/16 [redacted]w[redacted]d  7 lb 1.9 oz (3.23 kg) M Vag-Spont None N LIV      Name: Angela, Raymond      Apgar1: 9  Apgar5: 9   1 Preterm 06/2013 [redacted]w[redacted]d  5 lb 7 oz (2.466 kg) F Vag-Spont   LIV     Past Medical History:   Diagnosis Date   . Abnormal Pap smear of cervix 2007   . Anemia due to acute blood loss 01/04/2017   . Anxiety 2003    counseling   . Asthma    . Depression 2003    counseling, did have postpartum depression.                                                                    Past Surgical History:   Procedure Laterality Date   . COLONOSCOPY  06/2014    Did have a polyp removed, done for bleeding      Family History   Problem Relation Age of Onset   . Diabetes Paternal Grandmother    . Osteoporosis Maternal Grandmother    . Cancer Maternal Grandmother 70        leukemia   . Thyroid Cancer Mother    . Other Mother         depression and anxiety   . Mental Illness Maternal Uncle    . Thyroid Cancer Sister 60   .  Breast Cancer Other      Social History     Socioeconomic History   . Marital status: Married     Spouse name: Not on file   . Number of children: Not on file   . Years of education: Not on file   . Highest education level: Not on file   Occupational History   . Not on file   Social Needs   . Financial resource strain: Not on file   .  Food insecurity:     Worry: Not on file     Inability: Not on file   . Transportation needs:     Medical: Not on file     Non-medical: Not on file   Tobacco Use   . Smoking status: Never Smoker   . Smokeless tobacco: Never Used   Substance and Sexual Activity   . Alcohol use: No     Alcohol/week: 0.0 oz     Comment: non since the pregnancy   . Drug use: No   . Sexual activity: Yes     Partners: Male     Comment: none   Lifestyle   . Physical activity:     Days per week: Not on file     Minutes per session: Not on file   . Stress: Not on file   Relationships   . Social connections:     Talks on phone: Not on file     Gets together: Not on file     Attends religious service: Not on file     Active member of club or organization: Not on file     Attends meetings of clubs or organizations: Not on file     Relationship status: Not on file   . Intimate partner violence:     Fear of current or ex partner: Not on file     Emotionally abused: Not on file     Physically abused: Not on file     Forced sexual activity: Not on file   Other Topics Concern   . Not on file   Social History Narrative   . Not on file       MEDICATIONS:  Current Outpatient Medications   Medication Sig Dispense Refill   . sertraline (ZOLOFT) 50 MG tablet Take 1 tablet by mouth daily 30 tablet 5   . Prenatal Vit-Fe Fumarate-FA (PRENATAL VITAMIN PO) Take by mouth       No current facility-administered medications for this visit.        ALLERGIES:  Allergies as of 08/03/2017   . (No Known Allergies)         Review of Systems   Constitutional: Negative for chills and fever.   Respiratory: Negative for chest tightness,  shortness of breath and wheezing.    Cardiovascular: Negative for chest pain, palpitations and leg swelling.   Gastrointestinal: Negative for abdominal pain, blood in stool and nausea.   Endocrine: Negative for cold intolerance and heat intolerance.   Genitourinary: Negative for dyspareunia, dysuria, frequency, hematuria, menstrual problem, urgency, vaginal bleeding and vaginal discharge.        Breasts: no lumps, pain, nipple discharge, skin changes   Musculoskeletal: Positive for back pain (sees chiropractor for this). Negative for arthralgias and myalgias.   Skin: Negative for rash and wound.   Neurological: Negative for seizures and weakness.        No CVA  No migraines   Hematological: Negative for adenopathy. Does not bruise/bleed easily.   Psychiatric/Behavioral: Positive for dysphoric mood. Negative for suicidal ideas. The patient is not nervous/anxious.  Vitals:    08/03/17 1302   BP: 118/72   Pulse: 90   Weight: 119 lb (54 kg)   Height: 5\' 2"  (1.575 m)     Body mass index is 21.77 kg/m.     Physical Exam   Constitutional: She is oriented to person, place, and time. She appears well-developed and well-nourished. No distress.   HENT:   Head: Normocephalic and atraumatic.   Neck: Normal range of motion. Neck supple. No tracheal deviation present. No thyromegaly present.   Cardiovascular: Normal rate, regular rhythm and normal heart sounds.   Pulmonary/Chest: Effort normal and breath sounds normal. No respiratory distress. She has no wheezes. Right breast exhibits no mass, no nipple discharge, no skin change and no tenderness. Left breast exhibits no mass, no nipple discharge, no skin change and no tenderness. Breasts are symmetrical.   Abdominal: Soft. She exhibits no distension and no mass. There is no tenderness. There is no rebound and no guarding.   Genitourinary: Vagina normal and uterus normal. There is no rash or lesion on  the right labia. There is no rash or lesion on the left labia. Uterus is not enlarged and not tender. Cervix exhibits no motion tenderness, no discharge and no friability. Right adnexum displays no mass and no tenderness. Left adnexum displays no mass and no tenderness. No tenderness in the vagina. No vaginal discharge found.   Genitourinary Comments: Genitourinary: urethra with no masses, bladder nontender   Musculoskeletal: She exhibits no tenderness or deformity.   Normal Gait, normal cyanosis     Lymphadenopathy:        Right: No inguinal and no supraclavicular adenopathy present.        Left: No inguinal and no supraclavicular adenopathy present.   Neurological: She is alert and oriented to person, place, and time. No sensory deficit. She exhibits normal muscle tone.   Skin: Skin is warm and dry. No rash noted. She is not diaphoretic. No erythema.   Psychiatric: She has a normal mood and affect. Her behavior is normal. Judgment and thought content normal.            ASSESSMENT/PLAN:  Joviana was seen today for annual exam.    Diagnoses and all orders for this visit:    Well female exam with routine gynecological exam  -     PAP SMEAR    Fatigue, unspecified type  -     CBC Auto Differential; Future  -     TSH without Reflex; Future  -     T4, Free; Future  -     Vitamin D 25 Hydroxy; Future        Follow up in 1 year sooner if needed

## 2017-08-07 ENCOUNTER — Encounter: Attending: Obstetrics & Gynecology | Primary: Family Medicine

## 2017-08-08 LAB — HPV, HIGH RISK
HPV, Genotype 16: NOT DETECTED NA
HPV, Genotype 18: NOT DETECTED NA
Other HR HPV Genotypes: NOT DETECTED NA

## 2017-08-14 LAB — PAP SMEAR

## 2017-09-14 ENCOUNTER — Ambulatory Visit
Admit: 2017-09-14 | Discharge: 2017-09-14 | Payer: PRIVATE HEALTH INSURANCE | Attending: Registered Nurse | Primary: Family Medicine

## 2017-09-14 DIAGNOSIS — F32A Depression, unspecified: Secondary | ICD-10-CM

## 2017-09-14 NOTE — Progress Notes (Signed)
Chief Complaint   Patient presents with   . Depression       Patient's last menstrual period was 08/31/2017.     History:    Past Medical History:   Diagnosis Date   . Abnormal Pap smear of cervix 2007   . Anemia due to acute blood loss 01/04/2017   . Anxiety 2003    counseling   . Asthma    . Depression 2003    counseling, did have postpartum depression.        Past Surgical History:   Procedure Laterality Date   . COLONOSCOPY  06/2014    Did have a polyp removed, done for bleeding        Family History   Problem Relation Age of Onset   . Diabetes Paternal Grandmother    . Osteoporosis Maternal Grandmother    . Cancer Maternal Grandmother 70        leukemia   . Thyroid Cancer Mother    . Other Mother         depression and anxiety   . Mental Illness Maternal Uncle    . Thyroid Cancer Sister 43   . Breast Cancer Other        Social History     Socioeconomic History   . Marital status: Married     Spouse name: None   . Number of children: None   . Years of education: None   . Highest education level: None   Occupational History   . None   Social Needs   . Financial resource strain: None   . Food insecurity:     Worry: None     Inability: None   . Transportation needs:     Medical: None     Non-medical: None   Tobacco Use   . Smoking status: Never Smoker   . Smokeless tobacco: Never Used   Substance and Sexual Activity   . Alcohol use: No     Alcohol/week: 0.0 oz     Comment: non since the pregnancy   . Drug use: No   . Sexual activity: Yes     Partners: Male     Comment: none   Lifestyle   . Physical activity:     Days per week: None     Minutes per session: None   . Stress: None   Relationships   . Social connections:     Talks on phone: None     Gets together: None     Attends religious service: None     Active member of club or organization: None     Attends meetings of clubs or organizations: None     Relationship status: None   . Intimate partner violence:     Fear of current or ex partner: None      Emotionally abused: None     Physically abused: None     Forced sexual activity: None   Other Topics Concern   . None   Social History Narrative   . None       Allergies:    No Known Allergies    Medications:    Current Outpatient Medications on File Prior to Visit   Medication Sig Dispense Refill   . sertraline (ZOLOFT) 50 MG tablet Take 1 tablet by mouth daily 30 tablet 5   . Prenatal Vit-Fe Fumarate-FA (PRENATAL VITAMIN PO) Take by mouth       No current facility-administered medications on file prior  to visit.        HPI:    HPI   VD X 2     Last delivery was 12-2016  Feeling overwhelmed and tearful  On Zoloft currently, but wants to change back to [facility] bc she is weaning and did well on that in the past  Works full time @ [employer]  Had been fighteing a double ear infection off and on X 2 mos  Not sleeping well dt cough and congestion  Changing PCPs  Has an established counselor that she likes  Co decr libido dt feeling sick and exhausted  husb is an [employer] in [place]        ROS:    Review of Systems   Constitutional: Negative for appetite change, fatigue and unexpected weight change.   Respiratory: Negative for chest tightness and shortness of breath.    Cardiovascular: Negative for chest pain and palpitations.   Gastrointestinal: Negative for abdominal pain, constipation, diarrhea and nausea.   Endocrine: Negative for cold intolerance and heat intolerance.   Genitourinary: Negative for difficulty urinating, dyspareunia, dysuria, frequency, hematuria, menstrual problem, pelvic pain, urgency, vaginal bleeding, vaginal discharge and vaginal pain.   Skin: Negative for pallor and rash.   Neurological: Negative for dizziness and headaches.   Psychiatric/Behavioral: Positive for dysphoric mood and sleep disturbance. Negative for confusion, hallucinations, self-injury and suicidal ideas. The patient is nervous/anxious. The patient is not hyperactive.        Physical exam:  BP (!) 105/58   Pulse 89   Wt 117 lb (53.1 kg)    LMP 08/31/2017   Breastfeeding? Yes   BMI 21.40 kg/m   Physical Exam   Constitutional: She is oriented to person, place, and time. She appears well-developed and well-nourished.   HENT:   Head: Normocephalic.   Eyes: Pupils are equal, round, and reactive to light.   Neck: Normal range of motion.   Pulmonary/Chest: Effort normal. No respiratory distress.   Abdominal: Soft. There is no tenderness.   Musculoskeletal: Normal range of motion.   Neurological: She is alert and oriented to person, place, and time. She has normal reflexes.   Skin: Skin is warm and dry.   Psychiatric: She has a normal mood and affect. Her behavior is normal. Judgment and thought content normal.       Assessment and Plan:    Angela Raymond was seen today for depression.    Diagnoses and all orders for this visit:    Depression, unspecified depression type    1.  Wean fron NSG  Call when weaned  2. DC zoloft and start Lexapro  3. Establish with new PCP  4.  Keep appt next week with counselor    Pt agreeable to plan as above    Take new meds as ordered.  Call if Sx persist or worsen    No follow-ups on file.

## 2017-09-14 NOTE — Telephone Encounter (Signed)
S: Patient is calling [facility] today because she is having some depression and 'physically sick'  B: Delivered 12/23/17  A: She is having a lot of depression (hx of depression as well), she currently is on Zoloft. Denies any fevers, thoughts of harming herself or baby.  R: Disposition to see within 3 days- appt scheduled today at 2 with A. Williams    Reason for Disposition  . Depression symptoms occurring > 1 month after delivery    Protocols used: POSTPARTUM - DEPRESSION-ADULT-AH

## 2017-10-06 NOTE — Telephone Encounter (Signed)
S:37 y/o female calling with vaginal burning  B: Symptoms present a couple of days  A: She is currently on her period and it hurts to insert and remove a tampon due to burning. There is no odor, but she sees some raw/excoriated areas. She has been using monistat external cream but has not tried the vaginal miconazole.  R: She will try the miconazole suppositories and was also advised to try using desitin cream externally to help with her discomfort. She was scheduled for [place] appt. In [place] with TushkaNadia on Monday.    Reason for Disposition  ??? Symptoms of a yeast infection' (i.e., itchy, white discharge, not bad smelling) and not improved > 3 days following Care Advice    Protocols used: VAGINAL DISCHARGE-ADULT-[place]

## 2017-10-09 ENCOUNTER — Encounter: Attending: Advanced Practice Midwife | Primary: Family Medicine

## 2018-03-09 NOTE — Telephone Encounter (Signed)
Pt has open lab order for Vit D, T4, TSH and CBC.  If pt needs to have these drawn please task triage to contact the pt. If the order can be cancelled please task me back.

## 2018-03-12 NOTE — Telephone Encounter (Signed)
Ok to cancel orders

## 2018-03-12 NOTE — Telephone Encounter (Signed)
Orders cancelled

## 2018-08-09 ENCOUNTER — Encounter: Attending: Obstetrics & Gynecology | Primary: Family Medicine

## 2018-11-20 NOTE — Telephone Encounter (Signed)
S: Patient called sx's of yeast infection.  B:SX's since last Thursday and no relief after using 3 day Monistat.  A: White discharge and itching, [facility] [place], allergies, asking for something to be called in and she would like to be informed when something is ordered.   R: Message sent to the provider for medication approval. Patient instructed to call back with worsening symptoms, concerns or questions.     Reason for Disposition  ??? [1] Symptoms of a "yeast infection" (i.e., itchy, white discharge, not bad smelling) AND [2] not improved > 3 days following CARE ADVICE    Protocols used: VAGINAL DISCHARGE-ADULT-AH

## 2018-11-22 MED ORDER — TERCONAZOLE 0.8 % VA CREA
0.8 % | Freq: Every evening | VAGINAL | 0 refills | Status: AC
Start: 2018-11-22 — End: 2018-11-25

## 2018-11-22 NOTE — Telephone Encounter (Signed)
RX Terazol sent to pharm

## 2020-06-15 ENCOUNTER — Ambulatory Visit
Admit: 2020-06-15 | Discharge: 2020-06-15 | Payer: PRIVATE HEALTH INSURANCE | Attending: Obstetrics & Gynecology | Primary: Family Medicine

## 2020-06-15 DIAGNOSIS — Z01419 Encounter for gynecological examination (general) (routine) without abnormal findings: Secondary | ICD-10-CM

## 2020-06-15 NOTE — Progress Notes (Signed)
Angela Raymond  06/15/2020              40 y.o.            Primary Care Physician: Rosita Kea Factor  Chief Complaint   Patient presents with   ??? Gynecologic Exam     Chaperone declined      HPI : Angela Raymond is a 40 y.o. female here for annual exam.  ____________________________________________________________________  Gynecologic History:    Patient's last menstrual period was 06/03/2020 (approximate).  Menses are monthly and moderate flow. She states she has had an occasional extra menses in the same month    Sexually Active: Yes  ??  STD History:No  ??  Reversible Birth Control:Yes: vasectomy    ??  HPV vaccination completed: No      ??  Preventative Health Testing:  Date of Last Pap Smear: 2019 neg/neg  Abnormal Pap Smear History: yes in 2007    Colonoscopy : 2019    OB History   Gravida Para Term Preterm AB Living   2 2 1 1  0 2   SAB IAB Ectopic Molar Multiple Live Births   0 0 0 0 0 2      # Outcome Date GA Lbr Len/2nd Weight Sex Delivery Anes PTL Lv   2 Term 12/23/16 [redacted]w[redacted]d  7 lb 1.9 oz (3.23 kg) M Vag-Spont None N LIV      Name: Angela Raymond      Apgar1: 9  Apgar5: 9   1 Preterm 06/2013 [redacted]w[redacted]d  5 lb 7 oz (2.466 kg) F Vag-Spont   LIV     Past Medical History:   Diagnosis Date   ??? Abnormal Pap smear of cervix 2007   ??? Anemia due to acute blood loss 01/04/2017   ??? Anxiety 2003    counseling   ??? Asthma    ??? Depression 2003    counseling, did have postpartum depression.                                                                    Past Surgical History:   Procedure Laterality Date   ??? COLONOSCOPY  06/2014    Did have a polyp removed, done for bleeding      Family History   Problem Relation Age of Onset   ??? Diabetes Paternal Grandmother    ??? Osteoporosis Maternal Grandmother    ??? Cancer Maternal Grandmother 7        leukemia   ??? Thyroid Cancer Mother    ??? Other Mother         depression and anxiety   ??? Mental Illness Maternal Uncle    ??? Thyroid Cancer Sister 85   ??? Breast Cancer Other      Social  History     Socioeconomic History   ??? Marital status: Married     Spouse name: Not on file   ??? Number of children: Not on file   ??? Years of education: Not on file   ??? Highest education level: Not on file   Occupational History   ??? Not on file   Tobacco Use   ??? Smoking status: Never Smoker   ??? Smokeless  tobacco: Never Used   Substance and Sexual Activity   ??? Alcohol use: No     Alcohol/week: 0.0 standard drinks     Comment: non since the pregnancy   ??? Drug use: No   ??? Sexual activity: Yes     Partners: Male     Comment: none   Other Topics Concern   ??? Not on file   Social History Narrative   ??? Not on file     Social Determinants of Health     Financial Resource Strain:    ??? Difficulty of Paying Living Expenses: Not on file   Food Insecurity:    ??? Worried About Running Out of Food in the Last Year: Not on file   ??? Ran Out of Food in the Last Year: Not on file   Transportation Needs:    ??? Lack of Transportation (Medical): Not on file   ??? Lack of Transportation (Non-Medical): Not on file   Physical Activity:    ??? Days of Exercise per Week: Not on file   ??? Minutes of Exercise per Session: Not on file   Stress:    ??? Feeling of Stress : Not on file   Social Connections:    ??? Frequency of Communication with Friends and Family: Not on file   ??? Frequency of Social Gatherings with Friends and Family: Not on file   ??? Attends Religious Services: Not on file   ??? Active Member of Clubs or Organizations: Not on file   ??? Attends Banker Meetings: Not on file   ??? Marital Status: Not on file   Intimate Partner Violence:    ??? Fear of Current or Ex-Partner: Not on file   ??? Emotionally Abused: Not on file   ??? Physically Abused: Not on file   ??? Sexually Abused: Not on file   Housing Stability:    ??? Unable to Pay for Housing in the Last Year: Not on file   ??? Number of Places Lived in the Last Year: Not on file   ??? Unstable Housing in the Last Year: Not on file       MEDICATIONS:  Current Outpatient Medications   Medication Sig  Dispense Refill   ??? Multiple Vitamin (MULTIVITAMIN ADULT PO) Take by mouth     ??? sertraline (ZOLOFT) 50 MG tablet Take 1 tablet by mouth daily 30 tablet 5   ??? Prenatal Vit-Fe Fumarate-FA (PRENATAL VITAMIN PO) Take by mouth (Patient not taking: Reported on 06/15/2020)       No current facility-administered medications for this visit.       ALLERGIES:  Allergies as of 06/15/2020   ??? (No Known Allergies)         Review of Systems   Constitutional: Negative for chills and fever.   Respiratory: Negative for chest tightness, shortness of breath and wheezing.    Cardiovascular: Negative for chest pain, palpitations and leg swelling.   Gastrointestinal: Negative for abdominal pain, blood in stool, constipation, diarrhea and nausea.   Endocrine: Negative for cold intolerance and heat intolerance.   Genitourinary: Negative for dysuria, frequency, hematuria, menstrual problem, urgency, vaginal bleeding and vaginal discharge.        Breasts: no lumps, pain, nipple discharge, skin changes   Musculoskeletal: Negative for arthralgias and myalgias.   Skin: Negative for rash and wound.   Neurological: Negative for seizures and weakness.        No CVA  No migraines   Hematological: Negative  for adenopathy. Does not bruise/bleed easily.   Psychiatric/Behavioral: Positive for dysphoric mood. Negative for suicidal ideas. The patient is not nervous/anxious.                                                                                  Vitals:    06/15/20 1126   BP: 108/64   Pulse: 90   Temp: 97.9 ??F (36.6 ??C)   Weight: 135 lb (61.2 kg)   Height: 5\' 2"  (1.575 m)     Body mass index is 24.69 kg/m??.     Physical Exam  Constitutional:       General: She is not in acute distress.     Appearance: She is well-developed. She is not diaphoretic.   HENT:      Head: Normocephalic and atraumatic.   Neck:      Thyroid: No thyromegaly.      Trachea: No tracheal deviation.   Cardiovascular:      Rate and Rhythm: Normal rate and regular rhythm.       Heart sounds: Normal heart sounds.   Pulmonary:      Effort: Pulmonary effort is normal. No respiratory distress.      Breath sounds: Normal breath sounds. No wheezing.   Chest:   Breasts: Breasts are symmetrical.      Right: No mass, nipple discharge, skin change, tenderness or supraclavicular adenopathy.      Left: No mass, nipple discharge, skin change, tenderness or supraclavicular adenopathy.       Abdominal:      General: There is no distension.      Palpations: Abdomen is soft. There is no mass.      Tenderness: There is no abdominal tenderness. There is no guarding or rebound.   Genitourinary:     Labia:         Right: No rash or lesion.         Left: No rash or lesion.       Vagina: Normal. No vaginal discharge or tenderness.      Cervix: No cervical motion tenderness, discharge or friability.      Uterus: Not enlarged and not tender.       Adnexa:         Right: No mass or tenderness.          Left: No mass or tenderness.        Comments: Genitourinary: urethra with no masses, bladder nontender  Musculoskeletal:         General: No tenderness or deformity.      Cervical back: Normal range of motion and neck supple.      Comments: Normal Gait, normal cyanosis     Lymphadenopathy:      Upper Body:      Right upper body: No supraclavicular adenopathy.      Left upper body: No supraclavicular adenopathy.   Skin:     General: Skin is warm and dry.      Findings: No erythema or rash.   Neurological:      Mental Status: She is alert and oriented to person, place, and time.      Sensory: No sensory  deficit.      Motor: No abnormal muscle tone.   Psychiatric:         Behavior: Behavior normal.         Thought Content: Thought content normal.         Judgment: Judgment normal.              ASSESSMENT/PLAN:  Kameka was seen today for gynecologic exam.    Diagnoses and all orders for this visit:    Well female exam with routine gynecological exam    keep a menstrual  Calendar and if the cycle is abnormal call and a  pelvic ultrasound will be ordered    Follow up in 1 year sooner if needed

## 2021-02-07 DIAGNOSIS — S60445A External constriction of left ring finger, initial encounter: Secondary | ICD-10-CM | POA: Diagnosis not present

## 2021-05-28 DIAGNOSIS — M5032 Other cervical disc degeneration, mid-cervical region, unspecified level: Secondary | ICD-10-CM | POA: Diagnosis not present

## 2021-05-28 DIAGNOSIS — M62830 Muscle spasm of back: Secondary | ICD-10-CM | POA: Diagnosis not present

## 2021-05-28 DIAGNOSIS — M9901 Segmental and somatic dysfunction of cervical region: Secondary | ICD-10-CM | POA: Diagnosis not present

## 2021-05-28 DIAGNOSIS — M9903 Segmental and somatic dysfunction of lumbar region: Secondary | ICD-10-CM | POA: Diagnosis not present

## 2021-06-04 DIAGNOSIS — M9903 Segmental and somatic dysfunction of lumbar region: Secondary | ICD-10-CM | POA: Diagnosis not present

## 2021-06-04 DIAGNOSIS — M5032 Other cervical disc degeneration, mid-cervical region, unspecified level: Secondary | ICD-10-CM | POA: Diagnosis not present

## 2021-06-04 DIAGNOSIS — M9901 Segmental and somatic dysfunction of cervical region: Secondary | ICD-10-CM | POA: Diagnosis not present

## 2021-06-04 DIAGNOSIS — M62830 Muscle spasm of back: Secondary | ICD-10-CM | POA: Diagnosis not present

## 2021-06-18 ENCOUNTER — Other Ambulatory Visit: Payer: Self-pay | Admitting: Obstetrics & Gynecology

## 2021-06-18 DIAGNOSIS — L709 Acne, unspecified: Secondary | ICD-10-CM | POA: Diagnosis not present

## 2021-06-18 DIAGNOSIS — N926 Irregular menstruation, unspecified: Secondary | ICD-10-CM | POA: Diagnosis not present

## 2021-06-18 DIAGNOSIS — Z01419 Encounter for gynecological examination (general) (routine) without abnormal findings: Secondary | ICD-10-CM | POA: Diagnosis not present

## 2021-06-18 DIAGNOSIS — Z124 Encounter for screening for malignant neoplasm of cervix: Secondary | ICD-10-CM | POA: Diagnosis not present

## 2021-06-18 DIAGNOSIS — Z01411 Encounter for gynecological examination (general) (routine) with abnormal findings: Secondary | ICD-10-CM | POA: Diagnosis not present

## 2021-06-18 DIAGNOSIS — F32A Depression, unspecified: Secondary | ICD-10-CM | POA: Diagnosis not present

## 2021-06-18 DIAGNOSIS — Z808 Family history of malignant neoplasm of other organs or systems: Secondary | ICD-10-CM

## 2021-06-18 DIAGNOSIS — Z6825 Body mass index (BMI) 25.0-25.9, adult: Secondary | ICD-10-CM | POA: Diagnosis not present

## 2021-06-18 DIAGNOSIS — R8290 Unspecified abnormal findings in urine: Secondary | ICD-10-CM | POA: Diagnosis not present

## 2021-06-18 LAB — RESULTS CONSOLE HPV: CHL HPV: NEGATIVE

## 2021-06-21 ENCOUNTER — Encounter: Attending: Obstetrics & Gynecology | Primary: Family Medicine

## 2021-06-24 LAB — HM PAP SMEAR: HM Pap smear: NEGATIVE

## 2021-06-29 ENCOUNTER — Ambulatory Visit: Payer: Self-pay | Admitting: Physician Assistant

## 2021-06-30 ENCOUNTER — Ambulatory Visit
Admission: RE | Admit: 2021-06-30 | Discharge: 2021-06-30 | Disposition: A | Discharge status: Home / Self Care | Payer: Self-pay | Source: Ambulatory Visit | Attending: Obstetrics & Gynecology | Admitting: Obstetrics & Gynecology

## 2021-06-30 DIAGNOSIS — E041 Nontoxic single thyroid nodule: Secondary | ICD-10-CM | POA: Diagnosis not present

## 2021-06-30 DIAGNOSIS — Z808 Family history of malignant neoplasm of other organs or systems: Secondary | ICD-10-CM

## 2021-07-07 ENCOUNTER — Telehealth: Payer: Self-pay | Admitting: Internal Medicine

## 2021-07-07 NOTE — Telephone Encounter (Signed)
Good afternoon Dr. Marina Goodell, ?  ?We received a referral for patient to schedule a colonoscopy.  Her last colon was in 2019.  As DOD for 2/15 will be sending records to you.  Can you please review and advise on scheduling?  Thank you. ?

## 2021-07-08 NOTE — Telephone Encounter (Signed)
GI COURTESY RECORDS REVIEWED ? ?This patient is referred for colonoscopy.  Outside records review as follows: ?1.  Colonoscopy February 2016 ([place]) with advanced (1.5 cm) adenoma and small adenoma. ?2.  Colonoscopy April 2019 ([place]) with small adenoma and hyperplastic polyp.  Exam complete.  Bowel prep good. ? ?Okay to schedule patient for colonoscopy in [facility] with Dr. Marina Goodell for history of multiple and advanced adenomatous colon polyps at young age. ? ?Wilhemina Bonito. Eda Keys., M.D. ? Healthcare ?Division of Gastroenterology  ?

## 2021-07-12 NOTE — Telephone Encounter (Signed)
Called patient and left voicemail to return call to schedule procedure. ?

## 2021-07-23 ENCOUNTER — Encounter: Payer: Self-pay | Admitting: Physician Assistant

## 2021-07-23 ENCOUNTER — Ambulatory Visit: Payer: BC Managed Care – PPO | Admitting: Physician Assistant

## 2021-07-23 VITALS — BP 110/68 | HR 83 | Temp 97.9°F | Ht 62.8 in | Wt 136.4 lb

## 2021-07-23 DIAGNOSIS — Z0001 Encounter for general adult medical examination with abnormal findings: Secondary | ICD-10-CM | POA: Diagnosis not present

## 2021-07-23 DIAGNOSIS — L700 Acne vulgaris: Secondary | ICD-10-CM

## 2021-07-23 DIAGNOSIS — N926 Irregular menstruation, unspecified: Secondary | ICD-10-CM

## 2021-07-23 DIAGNOSIS — Z13220 Encounter for screening for lipoid disorders: Secondary | ICD-10-CM

## 2021-07-23 DIAGNOSIS — K635 Polyp of colon: Secondary | ICD-10-CM

## 2021-07-23 DIAGNOSIS — F32A Depression, unspecified: Secondary | ICD-10-CM | POA: Diagnosis not present

## 2021-07-23 DIAGNOSIS — Z136 Encounter for screening for cardiovascular disorders: Secondary | ICD-10-CM

## 2021-07-23 DIAGNOSIS — L709 Acne, unspecified: Secondary | ICD-10-CM | POA: Insufficient documentation

## 2021-07-23 DIAGNOSIS — F419 Anxiety disorder, unspecified: Secondary | ICD-10-CM | POA: Diagnosis not present

## 2021-07-23 DIAGNOSIS — E559 Vitamin D deficiency, unspecified: Secondary | ICD-10-CM | POA: Diagnosis not present

## 2021-07-23 LAB — COMPREHENSIVE METABOLIC PANEL
ALT: 10 U/L (ref 0–35)
AST: 15 U/L (ref 0–37)
Albumin: 4.4 g/dL (ref 3.5–5.2)
Alkaline Phosphatase: 46 U/L (ref 39–117)
BUN: 9 mg/dL (ref 6–23)
CO2: 26 mEq/L (ref 19–32)
Calcium: 9.3 mg/dL (ref 8.4–10.5)
Chloride: 104 mEq/L (ref 96–112)
Creatinine, Ser: 0.85 mg/dL (ref 0.40–1.20)
GFR: 86.45 mL/min (ref >60.00)
Glucose, Bld: 86 mg/dL (ref 70–99)
Potassium: 3.8 mEq/L (ref 3.5–5.1)
Sodium: 139 mEq/L (ref 135–145)
Total Bilirubin: 0.5 mg/dL (ref 0.2–1.2)
Total Protein: 6.7 g/dL (ref 6.0–8.3)

## 2021-07-23 LAB — CBC WITH DIFFERENTIAL/PLATELET
Basophils Absolute: 0 10*3/uL (ref 0.0–0.1)
Basophils Relative: 0.7 % (ref 0.0–3.0)
Eosinophils Absolute: 0.1 10*3/uL (ref 0.0–0.7)
Eosinophils Relative: 1.8 % (ref 0.0–5.0)
HCT: 41.7 % (ref 36.0–46.0)
Hemoglobin: 14.2 g/dL (ref 12.0–15.0)
Lymphocytes Relative: 34.2 % (ref 12.0–46.0)
Lymphs Abs: 2 10*3/uL (ref 0.7–4.0)
MCHC: 34 g/dL (ref 30.0–36.0)
MCV: 92.4 fl (ref 78.0–100.0)
Monocytes Absolute: 0.3 10*3/uL (ref 0.1–1.0)
Monocytes Relative: 5.4 % (ref 3.0–12.0)
Neutro Abs: 3.3 10*3/uL (ref 1.4–7.7)
Neutrophils Relative %: 57.9 % (ref 43.0–77.0)
Platelets: 261 10*3/uL (ref 150.0–400.0)
RBC: 4.52 Mil/uL (ref 3.87–5.11)
RDW: 12.8 % (ref 11.5–15.5)
WBC: 5.7 10*3/uL (ref 4.0–10.5)

## 2021-07-23 LAB — HEMOGLOBIN A1C: Hgb A1c MFr Bld: 5.4 % (ref 4.6–6.5)

## 2021-07-23 LAB — LIPID PANEL
Cholesterol: 161 mg/dL (ref 0–200)
HDL: 54 mg/dL (ref >39.00)
LDL Cholesterol: 96 mg/dL (ref 0–99)
NonHDL: 106.6
Total CHOL/HDL Ratio: 3
Triglycerides: 54 mg/dL (ref 0.0–149.0)
VLDL: 10.8 mg/dL (ref 0.0–40.0)

## 2021-07-23 LAB — VITAMIN D 25 HYDROXY (VIT D DEFICIENCY, FRACTURES): VITD: 30.78 ng/mL (ref 30.00–100.00)

## 2021-07-23 NOTE — Progress Notes (Signed)
? ? ?Subjective:  ?  ?Laura Mckinney is a 41 y.o. female and is here for a comprehensive physical exam. ? ?HPI ? ?Health Maintenance Due  ?Topic Date Due  ? HIV Screening  Never done  ? Hepatitis C Screening  Never done  ? ?Acute Concerns: ?None reported.  ? ?Chronic Issues: ?Anxiety/Depression ?Laura Mckinney is currently compliant with taking zoloft 50 mg daily with no adverse effects. Although this has been beneficial, she is interested in restarting her regular dosage of 100 mg. States that prior to the move she was taking zoloft 100 mg daily, but due to the move she was unable to get this refilled. As a result she was recommended to cut her dosage in half until she was able to establish with a new psychiatrist.  ? ?Additionally she is interested in restarting talk therapy. Due to recently moving here from [place], where she and her husband have been for 15 years, she has been home sick and finding it hard to adapt. Pt expresses that she hasn't been able to find a therapist with in person availability. At this time she would like a referral for in person talk therapy if possible. Despite this, she does find her husband to be a great support system and is managing well. Denies SI/HI.  ? ?Hx of Colon Polyps  ?In 2016, Laura Mckinney underwent a colonoscopy due to hematochezia, abdominal pain, and cramping. During this procedure, pt was found to have two big colon polyps that were benign and successfully removed. Due to this she was set to repeat this every 3-5 years which she did in 2019. As a result of this colonoscopy multiple smaller benign colon polyps were found and removed. At this time she is set to update this with Dr. Marina GoodellPerry, gastroenterology on 09/06/21 after being a year overdue due to the recent move.  ? ?Upon further discussion, Laura Mckinney expressed she has noticed after eating dairy she experiences bloating, stomach cramps, and diarrhea. At this time she is wondering what she could to to remedy this other than avoiding  dairy. Denies hematochezia, fhx of colon cancer, rectal bleeding, or constipation.  ? ?Hx of Cystic Acne/Irregular Periods ?Over the last couple of years, Laura Mckinney has been experiencing cystic acne as well as break through bleeding and irregular periods. Since moving to [place], she was able to establish care with Dr. Juliene PinaMody, OB/GYN, who started her on spironolactone 50 mg daily. Laura Mckinney has been compliant with taking this medication for the past month and has found it to be beneficial.  ? ?Health Maintenance: ?Immunizations -- Covid- Due ?Tdap- Due ?PAP -- ZOX;0960TD;2023 ?Bone Density -- N/A ?Dentistry- UTD ?Ophthalmology- Looking to update soon  ?Diet -- Eats all food groups; has cut down on dairy due to GI upset  ?Sleep habits -- No concerns ?Exercise -- As able--looking to participate in regular exercise ?Current Weight -- Stable ?Weight History: ?Wt Readings from Last 10 Encounters:  ?07/23/21 136 lb 6.4 oz (61.9 kg)  ? ?Body mass index is 24.32 kg/m?. ?Mood -- Stable ? ?Patient's last menstrual period was 06/25/2020 (approximate). ?Period characteristics -- N/A ?Birth control -- None reported ? ? ? reports current alcohol use of about 1.0 standard drink per week.  ?Tobacco Use: Low Risk   ? Smoking Tobacco Use: Never  ? Smokeless Tobacco Use: Never  ? Passive Exposure: Not on file  ? ? ? ?[facility]  ?Decreased Interest 1  ?Down, Depressed, Hopeless 1  ?PHQ - 2 Score 2  ? ? ? ?  Other providers/specialists: ?Patient Care Team: ?Jarold Motto, PA as PCP - General (Physician Assistant)  ? ?PMHx, SurgHx, SocialHx, Medications, and Allergies were reviewed in the Visit Navigator and updated as appropriate.  ? ?Past Medical History:  ?Diagnosis Date  ? Allergy   ? Anxiety   ? Depression   ? ? ? ?Past Surgical History:  ?Procedure Laterality Date  ? WISDOM TOOTH EXTRACTION Bilateral   ? ? ? ?Family History  ?Problem Relation Age of Onset  ? Thyroid cancer Mother   ? Anxiety disorder Mother   ? Depression  Mother   ? Other Mother   ?     Cervical dysplasia  ? Thyroid cancer Sister   ? Bipolar disorder Sister   ? Obesity Brother   ? Hyperlipidemia Brother   ? Leukemia Maternal Grandmother   ? Breast cancer Maternal Great-grandmother   ? ? ?Social History  ? ?Tobacco Use  ? Smoking status: Never  ? Smokeless tobacco: Never  ?Vaping Use  ? Vaping Use: Never used  ?Substance Use Topics  ? Alcohol use: Yes  ?  Alcohol/week: 1.0 standard drink  ?  Types: 1 Glasses of wine per week  ? Drug use: Never  ? ? ?Review of Systems:  ? ?Review of Systems  ?Constitutional:  Negative for chills, fever, malaise/fatigue and weight loss.  ?HENT:  Negative for hearing loss, sinus pain and sore throat.   ?Respiratory:  Negative for cough and hemoptysis.   ?Cardiovascular:  Negative for chest pain, palpitations, leg swelling and PND.  ?Gastrointestinal:  Negative for abdominal pain, constipation, diarrhea, heartburn, nausea and vomiting.  ?Genitourinary:  Negative for dysuria, frequency and urgency.  ?Musculoskeletal:  Negative for back pain, myalgias and neck pain.  ?Skin:  Negative for itching and rash.  ?Neurological:  Negative for dizziness, tingling, seizures and headaches.  ?Endo/Heme/Allergies:  Negative for polydipsia.  ?Psychiatric/Behavioral:  Negative for depression. The patient is not nervous/anxious.   ? ?Objective:  ? ?BP 110/68 (BP Location: Right Arm)   Pulse 83   Temp 97.9 ?F (36.6 ?C) (Temporal)   Ht 5' 2.8" (1.595 m)   Wt 136 lb 6.4 oz (61.9 kg)   LMP 06/25/2020 (Approximate)   SpO2 98%   BMI 24.32 kg/m?  ? ?General Appearance:    Alert, cooperative, no distress, appears stated age  ?Head:    Normocephalic, without obvious abnormality, atraumatic  ?Eyes:    PERRL, conjunctiva/corneas clear, EOM's intact, fundi  ?  benign, both eyes  ?Ears:    Normal TM's and external ear canals, both ears  ?Nose:   Nares normal, septum midline, mucosa normal, no drainage    or sinus tenderness  ?Throat:   Lips, mucosa, and tongue  normal; teeth and gums normal  ?Neck:   Supple, symmetrical, trachea midline, no adenopathy;  ?  thyroid:  no enlargement/tenderness/nodules; no carotid ?  bruit or JVD  ?Back:     Symmetric, no curvature, ROM normal, no CVA tenderness  ?Lungs:     Clear to auscultation bilaterally, respirations unlabored  ?Chest Wall:    No tenderness or deformity  ? Heart:    Regular rate and rhythm, S1 and S2 normal, no murmur, rub   or gallop  ?Breast Exam:    Deferred  ?Abdomen:     Soft, non-tender, bowel sounds active all four quadrants,  ?  no masses, no organomegaly  ?Genitalia:    Deferred  ?Rectal:    Deferred  ?Extremities:   Extremities normal, atraumatic, no  cyanosis or edema  ?Pulses:   2+ and symmetric all extremities  ?Skin:   Skin color, texture, turgor normal, no rashes or lesions  ?Lymph nodes:   Cervical, supraclavicular, and axillary nodes normal  ?Neurologic:   CNII-XII intact, normal strength, sensation and reflexes  ?  throughout  ? ? ?Assessment/Plan:  ? ?Encounter for general adult medical examination with abnormal findings ?Today patient counseled on age appropriate routine health concerns for screening and prevention, each reviewed and up to date or declined. Immunizations reviewed and up to date or declined. Labs ordered and reviewed. Risk factors for depression reviewed and negative. Hearing function and visual acuity are intact. ADLs screened and addressed as needed. Functional ability and level of safety reviewed and appropriate. Education, counseling and referrals performed based on assessed risks today. Patient provided with a copy of personalized plan for preventive services. ? ?Anxiety; Depression, unspecified depression type ?Uncontrolled ?Situational stress due to being in new area and limited social support ?Recommend counseling with [facility] -- card provided ?Increase zoloft to 100 mg ?Follow-up in 3 months, sooner if concerns ?I discussed with patient that if they develop any SI, to  tell someone immediately and seek medical attention. ? ?Polyp of colon, unspecified part of colon, unspecified type ?She is already scheduled for colonoscopy ? ?Acne vulgaris; Irregular periods ?Continue spiro;

## 2021-07-23 NOTE — Patient Instructions (Addendum)
It was great to see you! ? ?Laura Mckinney -- talk therapy at [facility] ? ?Increase zoloft to 100 mg daily on your own -- follow-up with me in 3 months for this ? ?Start an oral probiotic -- culturelle, align, digestive advantage (or any that are over the counter) to see if this helps with your stomach ? ?Please go to the lab for blood work.  ? ?Our office will call you with your results unless you have chosen to receive results via MyChart. ? ?If your blood work is normal we will follow-up each year for physicals and as scheduled for chronic medical problems. ? ?If anything is abnormal we will treat accordingly and get you in for a follow-up. ? ?Take care, ? ?Lelon Mast ?  ?

## 2021-08-06 ENCOUNTER — Telehealth: Payer: Self-pay | Admitting: Physician Assistant

## 2021-08-06 NOTE — Telephone Encounter (Signed)
.  Type of form received: Employment Health Assessment ? ?Additional comments:  ? ?Received by: Lorene [facility]   ? ?Form should be Faxed to: ? ?Form should be mailed to:   ? ?Is patient requesting call for pickup: yes ? ? ?Form placed:  In provider's box ? ?Attach charge sheet. yes ? ?Individual made aware of 3-5 business day turn around (Y/N)? yes ?  ?

## 2021-08-09 ENCOUNTER — Encounter: Payer: Self-pay | Admitting: Physician Assistant

## 2021-08-09 NOTE — Telephone Encounter (Signed)
Form has been placed on Sam desk. ?

## 2021-08-11 ENCOUNTER — Telehealth: Payer: Self-pay

## 2021-08-11 NOTE — Telephone Encounter (Signed)
LVM to let pt know that we have completed her staff Health Assessment/Medical Report but I do not have a fax #. Either she can come pick it up or cb with the fax #. I will have the paper in my possession until I hear back from pt. ?

## 2021-08-11 NOTE — Telephone Encounter (Signed)
PT stopped by trying to pick up form. Front office staff could not locate. Advised PT to complete MyChart set up and we would be willing to scan and send to her, or we can fax it tomorrow (08/12/21) if she can find an appropriate fax number.  ? ? ?

## 2021-08-12 NOTE — Telephone Encounter (Signed)
Form was placed up front ?

## 2021-08-12 NOTE — Telephone Encounter (Signed)
Forms was given to [facility]. ?

## 2021-08-23 ENCOUNTER — Ambulatory Visit (AMBULATORY_SURGERY_CENTER): Payer: BC Managed Care – PPO | Admitting: *Deleted

## 2021-08-23 VITALS — Ht 62.5 in | Wt 137.0 lb

## 2021-08-23 DIAGNOSIS — Z86010 Personal history of colonic polyps: Secondary | ICD-10-CM

## 2021-08-23 MED ORDER — NA SULFATE-K SULFATE-MG SULF 17.5-3.13-1.6 GM/177ML PO SOLN: 2.0000 | Freq: Once | ORAL | 0 refills | Status: AC

## 2021-08-23 NOTE — Progress Notes (Signed)
No egg or soy allergy known to patient  ?No issues known to pt with past sedation with any surgeries or procedures ?Patient denies ever being told they had issues or difficulty with intubation  ?No FH of Malignant Hyperthermia ?Pt is not on diet pills ?Pt is not on  home 02  ?Pt is not on blood thinners  ?Pt denies issues with constipation  ?No A fib or A flutter ?Pt instructed to use Singlecare.com or GoodRx for a price reduction on prep  ? ?PV completed over the phone. Pt verified name, DOB, address and insurance during PV today.  ?Pt mailed instruction packet with copy of consent form to read and not return, and instructions.  ?Pt encouraged to call with questions or issues.  ?If pt has My chart, procedure instructions sent via My Chart   ?

## 2021-08-26 ENCOUNTER — Encounter: Payer: Self-pay | Admitting: Internal Medicine

## 2021-09-06 ENCOUNTER — Encounter: Payer: Self-pay | Admitting: Internal Medicine

## 2021-09-06 ENCOUNTER — Ambulatory Visit (AMBULATORY_SURGERY_CENTER): Payer: BC Managed Care – PPO | Admitting: Internal Medicine

## 2021-09-06 VITALS — BP 108/68 | HR 70 | Temp 98.6°F | Resp 11 | Ht 62.75 in | Wt 137.0 lb

## 2021-09-06 DIAGNOSIS — Z86010 Personal history of colonic polyps: Secondary | ICD-10-CM

## 2021-09-06 DIAGNOSIS — Z1211 Encounter for screening for malignant neoplasm of colon: Secondary | ICD-10-CM | POA: Diagnosis not present

## 2021-09-06 MED ORDER — SODIUM CHLORIDE 0.9 % IV SOLN: 500.0000 mL | Freq: Once | INTRAVENOUS | Status: DC

## 2021-09-06 NOTE — Progress Notes (Signed)
Vss nad trans to pacu °

## 2021-09-06 NOTE — Progress Notes (Signed)
HISTORY OF PRESENT ILLNESS: ? ?Laura Mckinney is a 41 y.o. female with a history of multiple and advanced adenomatous colon polyps.  Previous examinations 2016 [facility]) and 2019 ([place]).  Now for follow-up surveillance.  No active complaints ? ?REVIEW OF SYSTEMS: ? ?All non-GI ROS negative. ?Past Medical History:  ?Diagnosis Date  ? Allergy   ? Anxiety   ? Depression   ? ? ?Past Surgical History:  ?Procedure Laterality Date  ? WISDOM TOOTH EXTRACTION Bilateral   ? ? ?Social History ?Laura Mckinney  reports that she has never smoked. She has never used smokeless tobacco. She reports current alcohol use of about 1.0 standard drink per week. She reports that she does not use drugs. ? ?family history includes Anxiety disorder in her mother; Bipolar disorder in her sister; Breast cancer in her maternal great-grandmother; Depression in her mother; Hyperlipidemia in her brother; Leukemia in her maternal grandmother; Obesity in her brother; Other in her mother; Thyroid cancer in her mother and sister. ? ?Not on File ? ?  ? ?PHYSICAL EXAMINATION: ? ?Vital signs: BP (!) 99/59   Pulse 80   Temp 98.6 ?F (37 ?C) (Temporal)   Ht 5' 2.75" (1.594 m)   Wt 137 lb (62.1 kg)   SpO2 99%   BMI 24.46 kg/m?  ?General: Well-developed, well-nourished, no acute distress ?HEENT: Sclerae are anicteric, conjunctiva pink. Oral mucosa intact ?Lungs: Clear ?Heart: Regular ?Abdomen: soft, nontender, nondistended, no obvious ascites, no peritoneal signs, normal bowel sounds. No organomegaly. ?Extremities: No edema ?Psychiatric: alert and oriented x3. Cooperative  ? ? ? ?ASSESSMENT: ? ?Personal history of multiple and advanced adenomatous colon polyps.  Presents for surveillance ? ? ?PLAN: ? ? ?Surveillance colonoscopy ? ? ? ?  ?

## 2021-09-06 NOTE — Patient Instructions (Signed)
YOU HAD AN ENDOSCOPIC PROCEDURE TODAY AT THE [facility]:   Refer to the procedure report that was given to you for any specific questions about what was found during the examination.  If the procedure report does not answer your questions, please call your gastroenterologist to clarify.  If you requested that your care partner not be given the details of your procedure findings, then the procedure report has been included in a sealed envelope for you to review at your convenience later.  YOU SHOULD EXPECT: Some feelings of bloating in the abdomen. Passage of more gas than usual.  Walking can help get rid of the air that was put into your GI tract during the procedure and reduce the bloating. If you had a lower endoscopy (such as a colonoscopy or flexible sigmoidoscopy) you may notice spotting of blood in your stool or on the toilet paper. If you underwent a bowel prep for your procedure, you may not have a normal bowel movement for a few days.  Please Note:  You might notice some irritation and congestion in your nose or some drainage.  This is from the oxygen used during your procedure.  There is no need for concern and it should clear up in a day or so.  SYMPTOMS TO REPORT IMMEDIATELY:   Following lower endoscopy (colonoscopy or flexible sigmoidoscopy):  Excessive amounts of blood in the stool  Significant tenderness or worsening of abdominal pains  Swelling of the abdomen that is new, acute  Fever of 100F or higher  For urgent or emergent issues, a gastroenterologist can be reached at any hour by calling [phone]. Do not use MyChart messaging for urgent concerns.    DIET:  We do recommend a small meal at first, but then you may proceed to your regular diet.  Drink plenty of fluids but you should avoid alcoholic beverages for 24 hours.  ACTIVITY:  You should plan to take it easy for the rest of today and you should NOT DRIVE or use heavy machinery until tomorrow (because  of the sedation medicines used during the test).    FOLLOW UP: Our staff will call the number listed on your records 48-72 hours following your procedure to check on you and address any questions or concerns that you may have regarding the information given to you following your procedure. If we do not reach you, we will leave a message.  We will attempt to reach you two times.  During this call, we will ask if you have developed any symptoms of COVID 19. If you develop any symptoms (ie: fever, flu-like symptoms, shortness of breath, cough etc.) before then, please call [phone].  If you test positive for Covid 19 in the 2 weeks post procedure, please call and report this information to us.    If any biopsies were taken you will be contacted by phone or by letter within the next 1-3 weeks.  Please call us at [phone] if you have not heard about the biopsies in 3 weeks.    SIGNATURES/CONFIDENTIALITY: You and/or your care partner have signed paperwork which will be entered into your electronic medical record.  These signatures attest to the fact that that the information above on your After Visit Summary has been reviewed and is understood.  Full responsibility of the confidentiality of this discharge information lies with you and/or your care-partner. 

## 2021-09-06 NOTE — Op Note (Signed)
[facility] ?Patient Name: Laura AllegraHeather Mckinney ?Procedure Date: 09/06/2021 9:47 AM ?MRN: [id number] ?Endoscopist: Wilhemina BonitoJohn N. Marina GoodellPerry , MD ?Age: [age] ?Referring MD:  ?Date of Birth: 04/10/1981 ?Gender: Female ?Account #: [id number] ?Procedure:                Colonoscopy ?Indications:              High risk colon cancer surveillance: Personal  ?                          history of adenoma (10 mm or greater in size), High  ?                          risk colon cancer surveillance: Personal history of  ?                          multiple (3 or more) adenomas. Previous  ?                          examinations performed in [place] 2016 and [place] ?Medicines:                Monitored Anesthesia Care ?Procedure:                Pre-Anesthesia Assessment: ?                          - Prior to the procedure, a History and Physical  ?                          was performed, and patient medications and  ?                          allergies were reviewed. The patient's tolerance of  ?                          previous anesthesia was also reviewed. The risks  ?                          and benefits of the procedure and the sedation  ?                          options and risks were discussed with the patient.  ?                          All questions were answered, and informed consent  ?                          was obtained. Prior Anticoagulants: The patient has  ?                          taken no previous anticoagulant or antiplatelet  ?                          agents. ASA Grade Assessment: II - A patient with  ?  mild systemic disease. After reviewing the risks  ?                          and benefits, the patient was deemed in  ?                          satisfactory condition to undergo the procedure. ?                          After obtaining informed consent, the colonoscope  ?                          was passed under direct vision. Throughout the  ?                          procedure, the patient's blood  pressure, pulse, and  ?                          oxygen saturations were monitored continuously. The  ?                          CF [id number] #[id number] was introduced through the anus  ?                          and advanced to the the cecum, identified by  ?                          appendiceal orifice and ileocecal valve. The  ?                          ileocecal valve, appendiceal orifice, and rectum  ?                          were photographed. The quality of the bowel  ?                          preparation was excellent. The colonoscopy was  ?                          performed without difficulty. The patient tolerated  ?                          the procedure well. The bowel preparation used was  ?                          SUPREP via split dose instruction. ?Scope In: 10:08:19 AM ?Scope Out: 10:19:57 AM ?Scope Withdrawal Time: 0 hours 8 minutes 58 seconds  ?Total Procedure Duration: 0 hours 11 minutes 38 seconds  ?Findings:                 The entire examined colon appeared normal on direct  ?                          and retroflexion views. ?Complications:  No immediate complications. Estimated blood loss:  ?                          None. ?Estimated Blood Loss:     Estimated blood loss: none. ?Impression:               - The entire examined colon is normal on direct and  ?                          retroflexion views. ?                          - No specimens collected. ?Recommendation:           - Repeat colonoscopy in 5 years for surveillance  ?                          (personal history of multiple and advanced  ?                          adenomatous colon polyps). ?                          - Patient has a contact number available for  ?                          emergencies. The signs and symptoms of potential  ?                          delayed complications were discussed with the  ?                          patient. Return to normal activities tomorrow.  ?                          Written  discharge instructions were provided to the  ?                          patient. ?                          - Resume previous diet. ?                          - Continue present medications. ?Wilhemina Bonito. Marina Goodell, MD ?09/06/2021 10:29:00 AM ?This report has been signed electronically. ?

## 2021-09-08 ENCOUNTER — Telehealth: Payer: Self-pay

## 2021-09-08 NOTE — Telephone Encounter (Signed)
Left message on answering machine. 

## 2021-09-14 ENCOUNTER — Encounter: Payer: Self-pay | Admitting: Family

## 2021-09-14 ENCOUNTER — Ambulatory Visit: Payer: BC Managed Care – PPO | Admitting: Family

## 2021-09-14 VITALS — BP 111/74 | HR 88 | Temp 98.6°F | Ht 62.0 in | Wt 136.4 lb

## 2021-09-14 DIAGNOSIS — J209 Acute bronchitis, unspecified: Secondary | ICD-10-CM

## 2021-09-14 MED ORDER — DOXYCYCLINE HYCLATE 100 MG PO TABS
100.0000 mg | ORAL_TABLET | Freq: Two times a day (BID) | ORAL | 0 refills | Status: DC
Start: 2021-09-14 — End: 2021-09-20

## 2021-09-14 NOTE — Patient Instructions (Signed)
It was very nice to see you today! ? ?I have sent an antibiotic, Doxycycline to your pharmacy, start this today. ?Below is what I recommend for allergy symptoms, but discuss with your allergist office what your plan should be regarding rescheduling your appointment and whether to start these meds or hold off: ?Start over the counter Flonase or Nasacort nasal spray 1 spray each nostril twice a day, 2 sprays twice a day is ok for bad symptoms for 1-2 weeks, then reduce to 1 spray twice a day or daily. ?Use Nasal saline spray (i.e., Simply Saline) or nasal saline lavage (i.e., NeilMed, Neti Pot) is recommended as needed and prior to medicated nasal sprays. ?May use over the counter antihistamines such as Xyzal (levocetirizine), Zyrtec (cetirizine), Claritin (loratadine), or Allegra (fexofenadine) daily as needed. May take twice a day if needed as long as it does not cause drowsiness. ?May use Pataday over the counter eye drops: 1 drop in each eye daily as needed for itchy/watery eyes.   ? ? ?PLEASE NOTE: ? ?If you had any lab tests please let us know if you have not heard back within a few days. You may see your results on MyChart before we have a chance to review them but we will give you a call once they are reviewed by [place]. If we ordered any referrals today, please let us know if you have not heard from their office within the next week.  ? ?

## 2021-09-14 NOTE — Progress Notes (Signed)
? ?Subjective:  ? ? ? Patient ID: Laura Mckinney, female    DOB: [birth year], 41 y.o.   MRN: [id number] ? ?Chief Complaint  ?Patient presents with  ? Sinus Problem  ?  Pt c/o cough,body aches, nasal congestion and  dry eyes for about 3 days, Has tried allegra now eyes are dry and itchy. Has also tried mucinex and ibuprofen which did help a little.  ? Hoarse  ? ?HPI: ?Sinus & cough sx:  pt reports having sx for weeks, made an appt with Allergy office for this Thursday, they said to stop antihistamines 4 days prior to appt, but she restarted yesterday because her symptoms are so bad, she is coughing up thick dark yellow, blood tinged mucus. Reports hoarseness, nasal congestions, yellow nasal drainage, postnasal drip, no fever, itchy, watery eyes. ? ?Assessment & Plan:  ? ?Problem List Items Addressed This Visit   ?None ?Visit Diagnoses   ? ? Acute bronchitis, unspecified organism    -  Primary  ? pt has been dealing with sinus, allergy sx for weeks, taking OTC antihistamines, no steroid nasal spray, has appt Thursday with allergist. Believe she now has bronchitis due to poorly treated sx, advised she call the allergy office, let them know she is on DOXY and whether to keep appt or reschedule. Advised if rescheduling to start steroid nasal spray,use & SE provided and for DOXY RX also. ? ?Relevant Medications  ? doxycycline (VIBRA-TABS) 100 MG tablet  ? ?  ? ?Outpatient Medications Prior to Visit  ?Medication Sig Dispense Refill  ? sertraline (ZOLOFT) 50 MG tablet Take 50 mg by mouth daily.    ? spironolactone (ALDACTONE) 50 MG tablet Take 50 mg by mouth daily.    ? ?No facility-administered medications prior to visit.  ? ? ?Past Medical History:  ?Diagnosis Date  ? Allergy   ? Anxiety   ? Depression   ? ? ?Past Surgical History:  ?Procedure Laterality Date  ? WISDOM TOOTH EXTRACTION Bilateral   ? ? ?Allergies  ?Allergen Reactions  ? Pollen Extract Itching  ? ? ?   ?Objective:  ?  ?Physical Exam ?Vitals and nursing note  reviewed.  ?Constitutional:   ?   Appearance: Normal appearance.  ?HENT:  ?   Right Ear: Tympanic membrane and ear canal normal.  ?   Left Ear: Ear canal normal. Tympanic membrane is scarred.  ?   Nose:  ?   Right Sinus: No frontal sinus tenderness.  ?   Left Sinus: No frontal sinus tenderness.  ?   Mouth/Throat:  ?   Mouth: Mucous membranes are moist.  ?   Pharynx: Posterior oropharyngeal erythema (mild) present. No pharyngeal swelling, oropharyngeal exudate or uvula swelling.  ?   Tonsils: No tonsillar exudate or tonsillar abscesses.  ?Cardiovascular:  ?   Rate and Rhythm: Normal rate and regular rhythm.  ?Pulmonary:  ?   Effort: Pulmonary effort is normal.  ?   Breath sounds: Normal breath sounds.  ?Musculoskeletal:     ?   General: Normal range of motion.  ?Skin: ?   General: Skin is warm and dry.  ?Neurological:  ?   Mental Status: She is alert.  ?Psychiatric:     ?   Mood and Affect: Mood normal.     ?   Behavior: Behavior normal.  ? ? ?BP 111/74 (BP Location: Left Arm, Patient Position: Sitting, Cuff Size: Large)   Pulse 88   Temp 98.6 ?F (37 ?C) (Temporal)  Ht 5\' 2"  (1.575 m)   Wt 136 lb 6 oz (61.9 kg)   LMP  (LMP Unknown)   SpO2 100%   BMI 24.94 kg/m?  ?Wt Readings from Last 3 Encounters:  ?09/14/21 136 lb 6 oz (61.9 kg)  ?09/06/21 137 lb (62.1 kg)  ?08/23/21 137 lb (62.1 kg)  ? ? ?08/25/21, NP ? ?

## 2021-09-15 ENCOUNTER — Telehealth: Payer: Self-pay | Admitting: Physician Assistant

## 2021-09-15 DIAGNOSIS — J209 Acute bronchitis, unspecified: Secondary | ICD-10-CM

## 2021-09-15 MED ORDER — ALBUTEROL SULFATE HFA 108 (90 BASE) MCG/ACT IN AERS: 2.0000 | INHALATION_SPRAY | Freq: Four times a day (QID) | RESPIRATORY_TRACT | 0 refills | Status: AC | PRN

## 2021-09-15 NOTE — Telephone Encounter (Signed)
inhaler sent, thx

## 2021-09-15 NOTE — Telephone Encounter (Signed)
Pt has some questions regarding medication she was prescribed yesterday and also about her symptoms. Asking for a call back ?

## 2021-09-15 NOTE — Telephone Encounter (Signed)
Pt states during the visit you talked about a oral steroid or inhaler she is feeling better but woke up this morning with a heavy weight feeling on her chest. She would like to try either.  ?

## 2021-09-15 NOTE — Telephone Encounter (Signed)
please call her and let me know, thx

## 2021-09-16 DIAGNOSIS — J40 Bronchitis, not specified as acute or chronic: Secondary | ICD-10-CM | POA: Diagnosis not present

## 2021-09-16 DIAGNOSIS — J040 Acute laryngitis: Secondary | ICD-10-CM | POA: Diagnosis not present

## 2021-09-19 DIAGNOSIS — R059 Cough, unspecified: Secondary | ICD-10-CM | POA: Diagnosis not present

## 2021-09-19 DIAGNOSIS — M549 Dorsalgia, unspecified: Secondary | ICD-10-CM | POA: Diagnosis not present

## 2021-09-19 DIAGNOSIS — R051 Acute cough: Secondary | ICD-10-CM | POA: Diagnosis not present

## 2021-09-20 ENCOUNTER — Encounter: Payer: Self-pay | Admitting: Physician Assistant

## 2021-09-20 ENCOUNTER — Ambulatory Visit: Payer: BC Managed Care – PPO | Admitting: Physician Assistant

## 2021-09-20 VITALS — BP 100/62 | HR 88 | Temp 98.1°F | Ht 62.0 in | Wt 137.5 lb

## 2021-09-20 DIAGNOSIS — F419 Anxiety disorder, unspecified: Secondary | ICD-10-CM | POA: Diagnosis not present

## 2021-09-20 DIAGNOSIS — F32A Depression, unspecified: Secondary | ICD-10-CM

## 2021-09-20 DIAGNOSIS — J209 Acute bronchitis, unspecified: Secondary | ICD-10-CM | POA: Diagnosis not present

## 2021-09-20 NOTE — Patient Instructions (Signed)
It was great to see you! ? ?Continue zoloft 50 mg ?If you need an increase, reach out, otherwise, let's follow-up in 6 months to reassess your mood in the fall ? ?Take care, ? ?Inda Coke PA-C  ?

## 2021-09-20 NOTE — Progress Notes (Signed)
Laura Mckinney is a 41 y.o. female here for a follow up of a pre-existing problem. ? ?History of Present Illness:  ? ?Chief Complaint  ?Patient presents with  ? Sinusitis  ?  Pt was seen at Urgent Care yesterday for Bronchitis and Laryngitis.  ? ? ?HPI ? ?Bronchitis  ?Patient has been having ongoing cough and hoarseness for the past 2 weeks. She has saw my colleague, Dulce Sellar, NP on 09/14/2021 for cough, body aches and nasal congestion. She was prescribed Doxycycline at that visit for bronchitis. She states antibiotics did not help. She then went to urgent care on 09/16/2021 with the similar symptoms. She was prescribed Medrol dose pack. ? ?She notes her symptoms started to get worse yesterday afternoon. She was having some back pain which was radiating to her chest. She was still taking her antibiotics and albuterol inhaler. Her symptoms continue to get worse yesterday. She went to urgent care yesterday with cough and hoarseness. Had chest x-ray which was normal. She was prescribed several medications at that visit. However, she notes she still has not picked up her medication. She did receive a toradol and depomedrol injection. States her symptoms seems to be improving. She has been feeling better today. Denies fever.  ? ?Anxiety/Depression ?Patient has had some issue with situational anxiety. This is manageable. She is currently taking Zoloft 50 mg daily. States she is tolerating her mediation without any adverse side effects. She is also following up with Carollee Herter at [facility] for this issue. She recently started her job and seems to be doing well.  Denies SI/HI.  ? ?Past Medical History:  ?Diagnosis Date  ? Allergy   ? Anxiety   ? Depression   ? ?  ?Social History  ? ?Tobacco Use  ? Smoking status: Never  ? Smokeless tobacco: Never  ?Vaping Use  ? Vaping Use: Never used  ?Substance Use Topics  ? Alcohol use: Yes  ?  Alcohol/week: 1.0 standard drink  ?  Types: 1 Glasses of wine per week  ? Drug use:  Never  ? ? ?Past Surgical History:  ?Procedure Laterality Date  ? WISDOM TOOTH EXTRACTION Bilateral   ? ? ?Family History  ?Problem Relation Age of Onset  ? Thyroid cancer Mother   ? Anxiety disorder Mother   ? Depression Mother   ? Other Mother   ?     Cervical dysplasia  ? Thyroid cancer Sister   ? Bipolar disorder Sister   ? Obesity Brother   ? Hyperlipidemia Brother   ? Leukemia Maternal Grandmother   ? Breast cancer Maternal Great-grandmother   ? Colon polyps Neg Hx   ? Colon cancer Neg Hx   ? Esophageal cancer Neg Hx   ? Stomach cancer Neg Hx   ? Rectal cancer Neg Hx   ? ? ?Allergies  ?Allergen Reactions  ? Bee Pollen Itching  ? Pollen Extract Itching  ? ? ?Current Medications:  ? ?Current Outpatient Medications:  ?  albuterol (VENTOLIN HFA) 108 (90 Base) MCG/ACT inhaler, Inhale 2 puffs into the lungs every 6 (six) hours as needed for wheezing or shortness of breath. or chest tightness., Disp: 8 g, Rfl: 0 ?  methylPREDNISolone (MEDROL DOSEPAK) 4 MG TBPK tablet, TAKE 6 TABLETS ON DAY 1 AS DIRECTED ON PACKAGE AND DECREASE BY 1 TAB EACH DAY FOR A TOTAL OF 6 DAYS, Disp: , Rfl:  ?  sertraline (ZOLOFT) 50 MG tablet, Take 50 mg by mouth daily., Disp: , Rfl:  ?  spironolactone (ALDACTONE) 50 MG tablet, Take 50 mg by mouth daily., Disp: , Rfl:  ?  benzonatate (TESSALON) 200 MG capsule, Take by mouth. (Patient not taking: Reported on 09/20/2021), Disp: , Rfl:  ?  ipratropium (ATROVENT) 0.03 % nasal spray, Place into both nostrils. (Patient not taking: Reported on 09/20/2021), Disp: , Rfl:  ?  meloxicam (MOBIC) 15 MG tablet, Take 15 mg by mouth daily. (Patient not taking: Reported on 09/20/2021), Disp: , Rfl:  ?  orphenadrine (NORFLEX) 100 MG tablet, Take 100 mg by mouth 2 (two) times daily as needed. (Patient not taking: Reported on 09/20/2021), Disp: , Rfl:  ?  pseudoephedrine (SUDAFED) 60 MG tablet, Take by mouth. (Patient not taking: Reported on 09/20/2021), Disp: , Rfl:   ? ?Review of Systems:  ? ?ROS ?Negative unless  otherwise specified per HPI.  ? ?Vitals:  ? ?Vitals:  ? 09/20/21 1330  ?BP: 100/62  ?Pulse: 88  ?Temp: 98.1 ?F (36.7 ?C)  ?TempSrc: Temporal  ?SpO2: 98%  ?Weight: 137 lb 8 oz (62.4 kg)  ?Height: 5\' 2"  (1.575 m)  ?   ?Body mass index is 25.15 kg/m?. ? ?Physical Exam:  ? ?Physical Exam ?Vitals and nursing note reviewed.  ?Constitutional:   ?   General: She is not in acute distress. ?   Appearance: She is well-developed. She is not ill-appearing or toxic-appearing.  ?Cardiovascular:  ?   Rate and Rhythm: Normal rate and regular rhythm.  ?   Pulses: Normal pulses.  ?   Heart sounds: Normal heart sounds, S1 normal and S2 normal.  ?Pulmonary:  ?   Effort: Pulmonary effort is normal.  ?   Breath sounds: Normal breath sounds.  ?Skin: ?   General: Skin is warm and dry.  ?Neurological:  ?   Mental Status: She is alert.  ?   GCS: GCS eye subscore is 4. GCS verbal subscore is 5. GCS motor subscore is 6.  ?Psychiatric:     ?   Speech: Speech normal.     ?   Behavior: Behavior normal. Behavior is cooperative.  ? ? ?Assessment and Plan:  ? ?Acute bronchitis, unspecified organism ?Improving ?Reviewed prior office visit, and two urgent care visits ?Discussed trialing atrovent nasal spray and delsym 12 hour cough syrup ?Complete use of medrol dose pack as prescribed ?Restart allegra (d/c 5 days prior to allergy appt) ?Follow-up prn ? ?Anxiety; Depression, unspecified depression type ?Improving ?Continue talk therapy and zoloft 50 mg daily ?Follow-up in 6 months, sooner if concerns ? ? ?I,Savera Zaman,acting as a for Neurosurgeon, PA.,have documented all relevant documentation on the behalf of [employer], PA,as directed by  Jarold Motto, PA while in the presence of Jarold Motto, Jarold Motto.  ? ? ?[place], PA-C ? ?

## 2021-10-13 DIAGNOSIS — J3089 Other allergic rhinitis: Secondary | ICD-10-CM | POA: Diagnosis not present

## 2021-10-13 DIAGNOSIS — J3081 Allergic rhinitis due to animal (cat) (dog) hair and dander: Secondary | ICD-10-CM | POA: Diagnosis not present

## 2021-10-13 DIAGNOSIS — J301 Allergic rhinitis due to pollen: Secondary | ICD-10-CM | POA: Diagnosis not present

## 2021-10-13 DIAGNOSIS — J4520 Mild intermittent asthma, uncomplicated: Secondary | ICD-10-CM | POA: Diagnosis not present

## 2021-10-18 ENCOUNTER — Ambulatory Visit: Payer: BC Managed Care – PPO | Admitting: Physician Assistant

## 2021-11-26 ENCOUNTER — Telehealth: Payer: Self-pay | Admitting: Physician Assistant

## 2021-11-26 NOTE — Telephone Encounter (Signed)
Pt picked up a refill of current medication on 11/25/21  Pt states she would like to have medication  dosage increased as discussed in previous appointment.    LAST APPOINTMENT DATE:   09/20/21 OV with PCP   NEXT APPOINTMENT DATE: 02/21/22 OV with PCP   MEDICATION: sertraline (ZOLOFT) 50 MG tablet [[id number]]    Is the patient out of medication?  No.  PHARMACY: [id number],  [place] [address]  Phone:  [phone]  Fax:  [phone]  DEA #:  [id number]

## 2021-11-30 NOTE — Telephone Encounter (Signed)
Please see message and advise 

## 2021-12-01 MED ORDER — SERTRALINE HCL 50 MG PO TABS: 75.0000 mg | ORAL_TABLET | Freq: Every day | ORAL | 1 refills | Status: DC

## 2021-12-01 NOTE — Telephone Encounter (Signed)
Left message on voicemail to call office. Rx was sent to [facility] as requested.

## 2021-12-01 NOTE — Telephone Encounter (Signed)
Rx sent to pharmacy   

## 2021-12-01 NOTE — Addendum Note (Signed)
Addended by: Jimmye Norman on: 12/01/2021 07:28 AM   Modules accepted: Orders

## 2022-01-14 DIAGNOSIS — Z111 Encounter for screening for respiratory tuberculosis: Secondary | ICD-10-CM | POA: Diagnosis not present

## 2022-01-17 DIAGNOSIS — Z681 Body mass index (BMI) 19 or less, adult: Secondary | ICD-10-CM | POA: Diagnosis not present

## 2022-01-17 DIAGNOSIS — R7611 Nonspecific reaction to tuberculin skin test without active tuberculosis: Secondary | ICD-10-CM | POA: Diagnosis not present

## 2022-01-17 DIAGNOSIS — Z111 Encounter for screening for respiratory tuberculosis: Secondary | ICD-10-CM | POA: Diagnosis not present

## 2022-01-20 DIAGNOSIS — J3081 Allergic rhinitis due to animal (cat) (dog) hair and dander: Secondary | ICD-10-CM | POA: Diagnosis not present

## 2022-01-20 DIAGNOSIS — J301 Allergic rhinitis due to pollen: Secondary | ICD-10-CM | POA: Diagnosis not present

## 2022-01-21 DIAGNOSIS — J3089 Other allergic rhinitis: Secondary | ICD-10-CM | POA: Diagnosis not present

## 2022-01-26 DIAGNOSIS — J3089 Other allergic rhinitis: Secondary | ICD-10-CM | POA: Diagnosis not present

## 2022-01-26 DIAGNOSIS — J301 Allergic rhinitis due to pollen: Secondary | ICD-10-CM | POA: Diagnosis not present

## 2022-01-26 DIAGNOSIS — J3081 Allergic rhinitis due to animal (cat) (dog) hair and dander: Secondary | ICD-10-CM | POA: Diagnosis not present

## 2022-01-29 ENCOUNTER — Other Ambulatory Visit: Payer: Self-pay | Admitting: Physician Assistant

## 2022-01-31 ENCOUNTER — Encounter: Payer: Self-pay | Admitting: *Deleted

## 2022-02-01 DIAGNOSIS — J3089 Other allergic rhinitis: Secondary | ICD-10-CM | POA: Diagnosis not present

## 2022-02-01 DIAGNOSIS — J301 Allergic rhinitis due to pollen: Secondary | ICD-10-CM | POA: Diagnosis not present

## 2022-02-01 DIAGNOSIS — J3081 Allergic rhinitis due to animal (cat) (dog) hair and dander: Secondary | ICD-10-CM | POA: Diagnosis not present

## 2022-02-08 DIAGNOSIS — J3081 Allergic rhinitis due to animal (cat) (dog) hair and dander: Secondary | ICD-10-CM | POA: Diagnosis not present

## 2022-02-08 DIAGNOSIS — J3089 Other allergic rhinitis: Secondary | ICD-10-CM | POA: Diagnosis not present

## 2022-02-08 DIAGNOSIS — J301 Allergic rhinitis due to pollen: Secondary | ICD-10-CM | POA: Diagnosis not present

## 2022-02-11 DIAGNOSIS — J3089 Other allergic rhinitis: Secondary | ICD-10-CM | POA: Diagnosis not present

## 2022-02-11 DIAGNOSIS — J3081 Allergic rhinitis due to animal (cat) (dog) hair and dander: Secondary | ICD-10-CM | POA: Diagnosis not present

## 2022-02-11 DIAGNOSIS — J301 Allergic rhinitis due to pollen: Secondary | ICD-10-CM | POA: Diagnosis not present

## 2022-02-15 DIAGNOSIS — J3089 Other allergic rhinitis: Secondary | ICD-10-CM | POA: Diagnosis not present

## 2022-02-15 DIAGNOSIS — J301 Allergic rhinitis due to pollen: Secondary | ICD-10-CM | POA: Diagnosis not present

## 2022-02-15 DIAGNOSIS — J3081 Allergic rhinitis due to animal (cat) (dog) hair and dander: Secondary | ICD-10-CM | POA: Diagnosis not present

## 2022-02-21 ENCOUNTER — Ambulatory Visit: Payer: BC Managed Care – PPO | Admitting: Physician Assistant

## 2022-02-21 ENCOUNTER — Encounter: Payer: Self-pay | Admitting: Physician Assistant

## 2022-02-21 VITALS — BP 90/66 | HR 75 | Temp 98.0°F | Ht 62.0 in | Wt 143.2 lb

## 2022-02-21 DIAGNOSIS — J3089 Other allergic rhinitis: Secondary | ICD-10-CM | POA: Diagnosis not present

## 2022-02-21 DIAGNOSIS — F32A Depression, unspecified: Secondary | ICD-10-CM

## 2022-02-21 DIAGNOSIS — Z23 Encounter for immunization: Secondary | ICD-10-CM | POA: Diagnosis not present

## 2022-02-21 DIAGNOSIS — L700 Acne vulgaris: Secondary | ICD-10-CM | POA: Diagnosis not present

## 2022-02-21 DIAGNOSIS — F419 Anxiety disorder, unspecified: Secondary | ICD-10-CM | POA: Diagnosis not present

## 2022-02-21 DIAGNOSIS — J301 Allergic rhinitis due to pollen: Secondary | ICD-10-CM | POA: Diagnosis not present

## 2022-02-21 DIAGNOSIS — E663 Overweight: Secondary | ICD-10-CM

## 2022-02-21 DIAGNOSIS — J3081 Allergic rhinitis due to animal (cat) (dog) hair and dander: Secondary | ICD-10-CM | POA: Diagnosis not present

## 2022-02-21 NOTE — Patient Instructions (Addendum)
It was great to see you!  Follow-up with Dr. Benjie Karvonen  I will place referral for nutritionist  Look at the gym [facility]   [address] [place], [place] [address]  Take care,  Inda Coke PA-C

## 2022-02-21 NOTE — Progress Notes (Signed)
Laura Mckinney is a 41 y.o. female here for a follow up of a pre-existing problem.  History of Present Illness:   Chief Complaint  Patient presents with   Anxiety   Depression    Depression  Patient reports that she is maintaining Sertraline HCL 75 mg well and is in a stable mood. She is currently taking Zoloft 75 mg. Denies SI/HI.  She is inquiring about having PMDD as she reports that prior to her period she experienced sudden rage, panic attack, and uncontrollable sobbing. She was alarmed with the symptoms but denies feeling suicidal. Patient reports she left work because she was experiencing a headache which turned into nausea which is how her symptoms first start out, all while she is sweating. She reports that every other period is heavy after giving birth and is requesting to be on birth control.   Overweight She is requesting a nutritionist as she is having difficulty losing weight and reports to be the heaviest she been since pregnancy. She would like to see a nutritionist and join a gym.  Acne Patient is complaining of ongoing struggle with acne.  Immunizations Patient has already received an influenza vaccine prior to today's visit  Past Medical History:  Diagnosis Date   Allergy    Anxiety    Depression      Social History   Tobacco Use   Smoking status: Never   Smokeless tobacco: Never  Vaping Use   Vaping Use: Never used  Substance Use Topics   Alcohol use: Yes    Alcohol/week: 1.0 standard drink of alcohol    Types: 1 Glasses of wine per week   Drug use: Never    Past Surgical History:  Procedure Laterality Date   WISDOM TOOTH EXTRACTION Bilateral     Family History  Problem Relation Age of Onset   Thyroid cancer Mother    Anxiety disorder Mother    Depression Mother    Other Mother        Cervical dysplasia   Thyroid cancer Sister    Bipolar disorder Sister    Obesity Brother    Hyperlipidemia Brother    Leukemia Maternal Grandmother     Breast cancer Maternal Great-grandmother    Colon polyps Neg Hx    Colon cancer Neg Hx    Esophageal cancer Neg Hx    Stomach cancer Neg Hx    Rectal cancer Neg Hx     Allergies  Allergen Reactions   Bee Pollen Itching   Pollen Extract Itching    Current Medications:   Current Outpatient Medications:    albuterol (VENTOLIN HFA) 108 (90 Base) MCG/ACT inhaler, Inhale 2 puffs into the lungs every 6 (six) hours as needed for wheezing or shortness of breath. or chest tightness., Disp: 8 g, Rfl: 0   cetirizine (ZYRTEC) 10 MG tablet, Take 10 mg by mouth daily., Disp: , Rfl:    EPINEPHrine 0.3 mg/0.3 mL IJ SOAJ injection, Inject into the muscle as directed., Disp: , Rfl:    sertraline (ZOLOFT) 50 MG tablet, TAKE 1 AND 1/2 TABLETS BY MOUTH DAILY, Disp: 135 tablet, Rfl: 0   spironolactone (ALDACTONE) 50 MG tablet, Take 50 mg by mouth daily., Disp: , Rfl:    Review of Systems:   Review of Systems  Psychiatric/Behavioral:  Positive for depression.     Vitals:   Vitals:   02/21/22 0907  BP: 90/66  Pulse: 75  Temp: 98 F (36.7 C)  TempSrc: Temporal  SpO2: 96%  Weight: 143 lb 4 oz (65 kg)  Height: 5\' 2"  (1.575 m)     Body mass index is 26.2 kg/m.  Physical Exam:   Physical Exam Constitutional:      General: She is not in acute distress.    Appearance: Normal appearance. She is well-developed. She is not ill-appearing.  HENT:     Head: Normocephalic and atraumatic.     Right Ear: External ear normal.     Left Ear: External ear normal.  Eyes:     General: Lids are normal.     Extraocular Movements: Extraocular movements intact.     Conjunctiva/sclera: Conjunctivae normal.     Pupils: Pupils are equal, round, and reactive to light.  Cardiovascular:     Rate and Rhythm: Normal rate and regular rhythm.     Heart sounds: Normal heart sounds. No murmur heard.    No gallop.  Pulmonary:     Effort: Pulmonary effort is normal. No respiratory distress.     Breath sounds:  Normal breath sounds. No wheezing or rales.  Musculoskeletal:        General: Normal range of motion.     Cervical back: Normal range of motion and neck supple.  Skin:    General: Skin is warm and dry.  Neurological:     Mental Status: She is alert and oriented to person, place, and time.  Psychiatric:        Attention and Perception: Attention and perception normal.        Mood and Affect: Mood normal.        Behavior: Behavior normal.        Thought Content: Thought content normal.        Judgment: Judgment normal.     Assessment and Plan:   Anxiety; Depression, unspecified depression type Overall controlled with 75 mg zoloft Will defer to ob-gyn regarding starting OCP and PMDD concerns as she is already established with them and has a few concerns regarding potential side effects of OCP Follow-up in 6 months, sooner if concerns I discussed with patient that if they develop any SI, to tell someone immediately and seek medical attention.  Acne vulgaris Consider increase of spironolactone -- will defer to gyn  Overweight Referral to RD Recommend consideration of [facility] gym Follow-up prn  I,Verona Buck,acting as a scribe for [employer], PA.,have documented all relevant documentation on the behalf of Inda Coke, PA,as directed by  Inda Coke, PA while in the presence of Inda Coke, [place].  I, Inda Coke, [place], have reviewed all documentation for this visit. The documentation on 02/21/22 for the exam, diagnosis, procedures, and orders are all accurate and complete.   Inda Coke, PA-C

## 2022-03-03 DIAGNOSIS — J3081 Allergic rhinitis due to animal (cat) (dog) hair and dander: Secondary | ICD-10-CM | POA: Diagnosis not present

## 2022-03-03 DIAGNOSIS — J301 Allergic rhinitis due to pollen: Secondary | ICD-10-CM | POA: Diagnosis not present

## 2022-03-03 DIAGNOSIS — J3089 Other allergic rhinitis: Secondary | ICD-10-CM | POA: Diagnosis not present

## 2022-03-10 DIAGNOSIS — J301 Allergic rhinitis due to pollen: Secondary | ICD-10-CM | POA: Diagnosis not present

## 2022-03-10 DIAGNOSIS — J3081 Allergic rhinitis due to animal (cat) (dog) hair and dander: Secondary | ICD-10-CM | POA: Diagnosis not present

## 2022-03-10 DIAGNOSIS — J3089 Other allergic rhinitis: Secondary | ICD-10-CM | POA: Diagnosis not present

## 2022-03-16 DIAGNOSIS — J3081 Allergic rhinitis due to animal (cat) (dog) hair and dander: Secondary | ICD-10-CM | POA: Diagnosis not present

## 2022-03-16 DIAGNOSIS — J301 Allergic rhinitis due to pollen: Secondary | ICD-10-CM | POA: Diagnosis not present

## 2022-03-16 DIAGNOSIS — J3089 Other allergic rhinitis: Secondary | ICD-10-CM | POA: Diagnosis not present

## 2022-03-22 DIAGNOSIS — J3089 Other allergic rhinitis: Secondary | ICD-10-CM | POA: Diagnosis not present

## 2022-03-22 DIAGNOSIS — J301 Allergic rhinitis due to pollen: Secondary | ICD-10-CM | POA: Diagnosis not present

## 2022-03-22 DIAGNOSIS — J3081 Allergic rhinitis due to animal (cat) (dog) hair and dander: Secondary | ICD-10-CM | POA: Diagnosis not present

## 2022-03-28 DIAGNOSIS — J301 Allergic rhinitis due to pollen: Secondary | ICD-10-CM | POA: Diagnosis not present

## 2022-03-28 DIAGNOSIS — J3089 Other allergic rhinitis: Secondary | ICD-10-CM | POA: Diagnosis not present

## 2022-03-28 DIAGNOSIS — F330 Major depressive disorder, recurrent, mild: Secondary | ICD-10-CM | POA: Diagnosis not present

## 2022-03-28 DIAGNOSIS — J3081 Allergic rhinitis due to animal (cat) (dog) hair and dander: Secondary | ICD-10-CM | POA: Diagnosis not present

## 2022-04-04 DIAGNOSIS — J301 Allergic rhinitis due to pollen: Secondary | ICD-10-CM | POA: Diagnosis not present

## 2022-04-04 DIAGNOSIS — J3081 Allergic rhinitis due to animal (cat) (dog) hair and dander: Secondary | ICD-10-CM | POA: Diagnosis not present

## 2022-04-04 DIAGNOSIS — J3089 Other allergic rhinitis: Secondary | ICD-10-CM | POA: Diagnosis not present

## 2022-04-04 DIAGNOSIS — F3281 Premenstrual dysphoric disorder: Secondary | ICD-10-CM | POA: Diagnosis not present

## 2022-04-11 DIAGNOSIS — J3081 Allergic rhinitis due to animal (cat) (dog) hair and dander: Secondary | ICD-10-CM | POA: Diagnosis not present

## 2022-04-11 DIAGNOSIS — J301 Allergic rhinitis due to pollen: Secondary | ICD-10-CM | POA: Diagnosis not present

## 2022-04-11 DIAGNOSIS — J3089 Other allergic rhinitis: Secondary | ICD-10-CM | POA: Diagnosis not present

## 2022-04-18 DIAGNOSIS — J3081 Allergic rhinitis due to animal (cat) (dog) hair and dander: Secondary | ICD-10-CM | POA: Diagnosis not present

## 2022-04-18 DIAGNOSIS — J301 Allergic rhinitis due to pollen: Secondary | ICD-10-CM | POA: Diagnosis not present

## 2022-04-18 DIAGNOSIS — J3089 Other allergic rhinitis: Secondary | ICD-10-CM | POA: Diagnosis not present

## 2022-04-27 DIAGNOSIS — J3089 Other allergic rhinitis: Secondary | ICD-10-CM | POA: Diagnosis not present

## 2022-04-27 DIAGNOSIS — J301 Allergic rhinitis due to pollen: Secondary | ICD-10-CM | POA: Diagnosis not present

## 2022-04-27 DIAGNOSIS — J3081 Allergic rhinitis due to animal (cat) (dog) hair and dander: Secondary | ICD-10-CM | POA: Diagnosis not present

## 2022-04-27 DIAGNOSIS — F330 Major depressive disorder, recurrent, mild: Secondary | ICD-10-CM | POA: Diagnosis not present

## 2022-05-04 DIAGNOSIS — J3089 Other allergic rhinitis: Secondary | ICD-10-CM | POA: Diagnosis not present

## 2022-05-04 DIAGNOSIS — J3081 Allergic rhinitis due to animal (cat) (dog) hair and dander: Secondary | ICD-10-CM | POA: Diagnosis not present

## 2022-05-04 DIAGNOSIS — J301 Allergic rhinitis due to pollen: Secondary | ICD-10-CM | POA: Diagnosis not present

## 2022-05-10 DIAGNOSIS — J3081 Allergic rhinitis due to animal (cat) (dog) hair and dander: Secondary | ICD-10-CM | POA: Diagnosis not present

## 2022-05-10 DIAGNOSIS — J3089 Other allergic rhinitis: Secondary | ICD-10-CM | POA: Diagnosis not present

## 2022-05-10 DIAGNOSIS — J301 Allergic rhinitis due to pollen: Secondary | ICD-10-CM | POA: Diagnosis not present

## 2022-05-17 DIAGNOSIS — J3081 Allergic rhinitis due to animal (cat) (dog) hair and dander: Secondary | ICD-10-CM | POA: Diagnosis not present

## 2022-05-17 DIAGNOSIS — J3089 Other allergic rhinitis: Secondary | ICD-10-CM | POA: Diagnosis not present

## 2022-05-17 DIAGNOSIS — J301 Allergic rhinitis due to pollen: Secondary | ICD-10-CM | POA: Diagnosis not present

## 2022-05-23 DIAGNOSIS — J3081 Allergic rhinitis due to animal (cat) (dog) hair and dander: Secondary | ICD-10-CM | POA: Diagnosis not present

## 2022-05-23 DIAGNOSIS — J3089 Other allergic rhinitis: Secondary | ICD-10-CM | POA: Diagnosis not present

## 2022-05-23 DIAGNOSIS — J301 Allergic rhinitis due to pollen: Secondary | ICD-10-CM | POA: Diagnosis not present

## 2022-05-30 DIAGNOSIS — J3089 Other allergic rhinitis: Secondary | ICD-10-CM | POA: Diagnosis not present

## 2022-05-30 DIAGNOSIS — J301 Allergic rhinitis due to pollen: Secondary | ICD-10-CM | POA: Diagnosis not present

## 2022-05-30 DIAGNOSIS — J3081 Allergic rhinitis due to animal (cat) (dog) hair and dander: Secondary | ICD-10-CM | POA: Diagnosis not present

## 2022-06-06 DIAGNOSIS — J301 Allergic rhinitis due to pollen: Secondary | ICD-10-CM | POA: Diagnosis not present

## 2022-06-06 DIAGNOSIS — J3081 Allergic rhinitis due to animal (cat) (dog) hair and dander: Secondary | ICD-10-CM | POA: Diagnosis not present

## 2022-06-06 DIAGNOSIS — J3089 Other allergic rhinitis: Secondary | ICD-10-CM | POA: Diagnosis not present

## 2022-06-08 DIAGNOSIS — F330 Major depressive disorder, recurrent, mild: Secondary | ICD-10-CM | POA: Diagnosis not present

## 2022-06-13 DIAGNOSIS — J3081 Allergic rhinitis due to animal (cat) (dog) hair and dander: Secondary | ICD-10-CM | POA: Diagnosis not present

## 2022-06-13 DIAGNOSIS — J301 Allergic rhinitis due to pollen: Secondary | ICD-10-CM | POA: Diagnosis not present

## 2022-06-13 DIAGNOSIS — J3089 Other allergic rhinitis: Secondary | ICD-10-CM | POA: Diagnosis not present

## 2022-06-24 DIAGNOSIS — J301 Allergic rhinitis due to pollen: Secondary | ICD-10-CM | POA: Diagnosis not present

## 2022-06-24 DIAGNOSIS — J3089 Other allergic rhinitis: Secondary | ICD-10-CM | POA: Diagnosis not present

## 2022-06-24 DIAGNOSIS — J3081 Allergic rhinitis due to animal (cat) (dog) hair and dander: Secondary | ICD-10-CM | POA: Diagnosis not present

## 2022-06-27 DIAGNOSIS — Z6825 Body mass index (BMI) 25.0-25.9, adult: Secondary | ICD-10-CM | POA: Diagnosis not present

## 2022-06-27 DIAGNOSIS — Z1231 Encounter for screening mammogram for malignant neoplasm of breast: Secondary | ICD-10-CM | POA: Diagnosis not present

## 2022-06-27 DIAGNOSIS — Z01419 Encounter for gynecological examination (general) (routine) without abnormal findings: Secondary | ICD-10-CM | POA: Diagnosis not present

## 2022-06-27 LAB — IMAGING RESULTS - SCANNED

## 2022-06-30 DIAGNOSIS — J301 Allergic rhinitis due to pollen: Secondary | ICD-10-CM | POA: Diagnosis not present

## 2022-06-30 DIAGNOSIS — J3081 Allergic rhinitis due to animal (cat) (dog) hair and dander: Secondary | ICD-10-CM | POA: Diagnosis not present

## 2022-06-30 DIAGNOSIS — J3089 Other allergic rhinitis: Secondary | ICD-10-CM | POA: Diagnosis not present

## 2022-07-07 DIAGNOSIS — J301 Allergic rhinitis due to pollen: Secondary | ICD-10-CM | POA: Diagnosis not present

## 2022-07-07 DIAGNOSIS — J3081 Allergic rhinitis due to animal (cat) (dog) hair and dander: Secondary | ICD-10-CM | POA: Diagnosis not present

## 2022-07-07 DIAGNOSIS — J3089 Other allergic rhinitis: Secondary | ICD-10-CM | POA: Diagnosis not present

## 2022-07-14 DIAGNOSIS — J3081 Allergic rhinitis due to animal (cat) (dog) hair and dander: Secondary | ICD-10-CM | POA: Diagnosis not present

## 2022-07-14 DIAGNOSIS — J301 Allergic rhinitis due to pollen: Secondary | ICD-10-CM | POA: Diagnosis not present

## 2022-07-14 DIAGNOSIS — J3089 Other allergic rhinitis: Secondary | ICD-10-CM | POA: Diagnosis not present

## 2022-07-15 DIAGNOSIS — J3089 Other allergic rhinitis: Secondary | ICD-10-CM | POA: Diagnosis not present

## 2022-07-21 DIAGNOSIS — J3081 Allergic rhinitis due to animal (cat) (dog) hair and dander: Secondary | ICD-10-CM | POA: Diagnosis not present

## 2022-07-21 DIAGNOSIS — J3089 Other allergic rhinitis: Secondary | ICD-10-CM | POA: Diagnosis not present

## 2022-07-21 DIAGNOSIS — J4520 Mild intermittent asthma, uncomplicated: Secondary | ICD-10-CM | POA: Diagnosis not present

## 2022-07-21 DIAGNOSIS — J301 Allergic rhinitis due to pollen: Secondary | ICD-10-CM | POA: Diagnosis not present

## 2022-07-28 DIAGNOSIS — J3089 Other allergic rhinitis: Secondary | ICD-10-CM | POA: Diagnosis not present

## 2022-07-28 DIAGNOSIS — J3081 Allergic rhinitis due to animal (cat) (dog) hair and dander: Secondary | ICD-10-CM | POA: Diagnosis not present

## 2022-07-28 DIAGNOSIS — J301 Allergic rhinitis due to pollen: Secondary | ICD-10-CM | POA: Diagnosis not present

## 2022-08-04 DIAGNOSIS — J301 Allergic rhinitis due to pollen: Secondary | ICD-10-CM | POA: Diagnosis not present

## 2022-08-04 DIAGNOSIS — J3089 Other allergic rhinitis: Secondary | ICD-10-CM | POA: Diagnosis not present

## 2022-08-04 DIAGNOSIS — J3081 Allergic rhinitis due to animal (cat) (dog) hair and dander: Secondary | ICD-10-CM | POA: Diagnosis not present

## 2022-08-10 DIAGNOSIS — J3081 Allergic rhinitis due to animal (cat) (dog) hair and dander: Secondary | ICD-10-CM | POA: Diagnosis not present

## 2022-08-10 DIAGNOSIS — J301 Allergic rhinitis due to pollen: Secondary | ICD-10-CM | POA: Diagnosis not present

## 2022-08-10 DIAGNOSIS — J3089 Other allergic rhinitis: Secondary | ICD-10-CM | POA: Diagnosis not present

## 2022-08-16 DIAGNOSIS — J3081 Allergic rhinitis due to animal (cat) (dog) hair and dander: Secondary | ICD-10-CM | POA: Diagnosis not present

## 2022-08-16 DIAGNOSIS — J301 Allergic rhinitis due to pollen: Secondary | ICD-10-CM | POA: Diagnosis not present

## 2022-08-16 DIAGNOSIS — J3089 Other allergic rhinitis: Secondary | ICD-10-CM | POA: Diagnosis not present

## 2022-08-23 DIAGNOSIS — J3081 Allergic rhinitis due to animal (cat) (dog) hair and dander: Secondary | ICD-10-CM | POA: Diagnosis not present

## 2022-08-23 DIAGNOSIS — J301 Allergic rhinitis due to pollen: Secondary | ICD-10-CM | POA: Diagnosis not present

## 2022-08-23 DIAGNOSIS — J3089 Other allergic rhinitis: Secondary | ICD-10-CM | POA: Diagnosis not present

## 2022-08-30 DIAGNOSIS — J301 Allergic rhinitis due to pollen: Secondary | ICD-10-CM | POA: Diagnosis not present

## 2022-08-30 DIAGNOSIS — J3089 Other allergic rhinitis: Secondary | ICD-10-CM | POA: Diagnosis not present

## 2022-08-30 DIAGNOSIS — J3081 Allergic rhinitis due to animal (cat) (dog) hair and dander: Secondary | ICD-10-CM | POA: Diagnosis not present

## 2022-09-06 DIAGNOSIS — J3081 Allergic rhinitis due to animal (cat) (dog) hair and dander: Secondary | ICD-10-CM | POA: Diagnosis not present

## 2022-09-06 DIAGNOSIS — J3089 Other allergic rhinitis: Secondary | ICD-10-CM | POA: Diagnosis not present

## 2022-09-06 DIAGNOSIS — J301 Allergic rhinitis due to pollen: Secondary | ICD-10-CM | POA: Diagnosis not present

## 2022-09-13 DIAGNOSIS — J301 Allergic rhinitis due to pollen: Secondary | ICD-10-CM | POA: Diagnosis not present

## 2022-09-13 DIAGNOSIS — J3081 Allergic rhinitis due to animal (cat) (dog) hair and dander: Secondary | ICD-10-CM | POA: Diagnosis not present

## 2022-09-13 DIAGNOSIS — J3089 Other allergic rhinitis: Secondary | ICD-10-CM | POA: Diagnosis not present

## 2022-09-20 DIAGNOSIS — J3081 Allergic rhinitis due to animal (cat) (dog) hair and dander: Secondary | ICD-10-CM | POA: Diagnosis not present

## 2022-09-20 DIAGNOSIS — J301 Allergic rhinitis due to pollen: Secondary | ICD-10-CM | POA: Diagnosis not present

## 2022-09-20 DIAGNOSIS — J3089 Other allergic rhinitis: Secondary | ICD-10-CM | POA: Diagnosis not present

## 2022-09-27 DIAGNOSIS — J301 Allergic rhinitis due to pollen: Secondary | ICD-10-CM | POA: Diagnosis not present

## 2022-09-27 DIAGNOSIS — J3089 Other allergic rhinitis: Secondary | ICD-10-CM | POA: Diagnosis not present

## 2022-09-27 DIAGNOSIS — J3081 Allergic rhinitis due to animal (cat) (dog) hair and dander: Secondary | ICD-10-CM | POA: Diagnosis not present

## 2022-10-04 DIAGNOSIS — J3081 Allergic rhinitis due to animal (cat) (dog) hair and dander: Secondary | ICD-10-CM | POA: Diagnosis not present

## 2022-10-04 DIAGNOSIS — F330 Major depressive disorder, recurrent, mild: Secondary | ICD-10-CM | POA: Diagnosis not present

## 2022-10-04 DIAGNOSIS — J3089 Other allergic rhinitis: Secondary | ICD-10-CM | POA: Diagnosis not present

## 2022-10-04 DIAGNOSIS — J301 Allergic rhinitis due to pollen: Secondary | ICD-10-CM | POA: Diagnosis not present

## 2022-10-11 DIAGNOSIS — J3089 Other allergic rhinitis: Secondary | ICD-10-CM | POA: Diagnosis not present

## 2022-10-11 DIAGNOSIS — J3081 Allergic rhinitis due to animal (cat) (dog) hair and dander: Secondary | ICD-10-CM | POA: Diagnosis not present

## 2022-10-11 DIAGNOSIS — J301 Allergic rhinitis due to pollen: Secondary | ICD-10-CM | POA: Diagnosis not present

## 2022-10-18 DIAGNOSIS — J301 Allergic rhinitis due to pollen: Secondary | ICD-10-CM | POA: Diagnosis not present

## 2022-10-18 DIAGNOSIS — J3089 Other allergic rhinitis: Secondary | ICD-10-CM | POA: Diagnosis not present

## 2022-10-18 DIAGNOSIS — J3081 Allergic rhinitis due to animal (cat) (dog) hair and dander: Secondary | ICD-10-CM | POA: Diagnosis not present

## 2022-10-25 DIAGNOSIS — J301 Allergic rhinitis due to pollen: Secondary | ICD-10-CM | POA: Diagnosis not present

## 2022-10-25 DIAGNOSIS — J3081 Allergic rhinitis due to animal (cat) (dog) hair and dander: Secondary | ICD-10-CM | POA: Diagnosis not present

## 2022-10-25 DIAGNOSIS — F330 Major depressive disorder, recurrent, mild: Secondary | ICD-10-CM | POA: Diagnosis not present

## 2022-10-25 DIAGNOSIS — J3089 Other allergic rhinitis: Secondary | ICD-10-CM | POA: Diagnosis not present

## 2022-11-08 DIAGNOSIS — J3089 Other allergic rhinitis: Secondary | ICD-10-CM | POA: Diagnosis not present

## 2022-11-08 DIAGNOSIS — J301 Allergic rhinitis due to pollen: Secondary | ICD-10-CM | POA: Diagnosis not present

## 2022-11-08 DIAGNOSIS — J3081 Allergic rhinitis due to animal (cat) (dog) hair and dander: Secondary | ICD-10-CM | POA: Diagnosis not present

## 2022-11-14 DIAGNOSIS — J3089 Other allergic rhinitis: Secondary | ICD-10-CM | POA: Diagnosis not present

## 2022-11-14 DIAGNOSIS — J301 Allergic rhinitis due to pollen: Secondary | ICD-10-CM | POA: Diagnosis not present

## 2022-11-14 DIAGNOSIS — J3081 Allergic rhinitis due to animal (cat) (dog) hair and dander: Secondary | ICD-10-CM | POA: Diagnosis not present

## 2022-11-21 DIAGNOSIS — J301 Allergic rhinitis due to pollen: Secondary | ICD-10-CM | POA: Diagnosis not present

## 2022-11-21 DIAGNOSIS — J3089 Other allergic rhinitis: Secondary | ICD-10-CM | POA: Diagnosis not present

## 2022-11-21 DIAGNOSIS — J3081 Allergic rhinitis due to animal (cat) (dog) hair and dander: Secondary | ICD-10-CM | POA: Diagnosis not present

## 2022-11-23 DIAGNOSIS — F330 Major depressive disorder, recurrent, mild: Secondary | ICD-10-CM | POA: Diagnosis not present

## 2022-11-29 ENCOUNTER — Other Ambulatory Visit: Payer: Self-pay | Admitting: Physician Assistant

## 2022-11-29 DIAGNOSIS — J3089 Other allergic rhinitis: Secondary | ICD-10-CM | POA: Diagnosis not present

## 2022-11-29 DIAGNOSIS — J301 Allergic rhinitis due to pollen: Secondary | ICD-10-CM | POA: Diagnosis not present

## 2022-11-29 DIAGNOSIS — J3081 Allergic rhinitis due to animal (cat) (dog) hair and dander: Secondary | ICD-10-CM | POA: Diagnosis not present

## 2022-11-29 MED ORDER — SERTRALINE HCL 50 MG PO TABS: 75.0000 mg | ORAL_TABLET | Freq: Every day | ORAL | 0 refills | Status: AC

## 2022-12-07 DIAGNOSIS — J3081 Allergic rhinitis due to animal (cat) (dog) hair and dander: Secondary | ICD-10-CM | POA: Diagnosis not present

## 2022-12-07 DIAGNOSIS — J301 Allergic rhinitis due to pollen: Secondary | ICD-10-CM | POA: Diagnosis not present

## 2022-12-07 DIAGNOSIS — J3089 Other allergic rhinitis: Secondary | ICD-10-CM | POA: Diagnosis not present

## 2022-12-13 DIAGNOSIS — J3089 Other allergic rhinitis: Secondary | ICD-10-CM | POA: Diagnosis not present

## 2022-12-13 DIAGNOSIS — J3081 Allergic rhinitis due to animal (cat) (dog) hair and dander: Secondary | ICD-10-CM | POA: Diagnosis not present

## 2022-12-13 DIAGNOSIS — J301 Allergic rhinitis due to pollen: Secondary | ICD-10-CM | POA: Diagnosis not present

## 2022-12-14 DIAGNOSIS — J3089 Other allergic rhinitis: Secondary | ICD-10-CM | POA: Diagnosis not present

## 2022-12-20 DIAGNOSIS — J3081 Allergic rhinitis due to animal (cat) (dog) hair and dander: Secondary | ICD-10-CM | POA: Diagnosis not present

## 2022-12-20 DIAGNOSIS — J301 Allergic rhinitis due to pollen: Secondary | ICD-10-CM | POA: Diagnosis not present

## 2022-12-20 DIAGNOSIS — J3089 Other allergic rhinitis: Secondary | ICD-10-CM | POA: Diagnosis not present

## 2022-12-22 DIAGNOSIS — Z5181 Encounter for therapeutic drug level monitoring: Secondary | ICD-10-CM | POA: Diagnosis not present

## 2022-12-22 DIAGNOSIS — L918 Other hypertrophic disorders of the skin: Secondary | ICD-10-CM | POA: Diagnosis not present

## 2022-12-22 DIAGNOSIS — L700 Acne vulgaris: Secondary | ICD-10-CM | POA: Diagnosis not present

## 2022-12-27 DIAGNOSIS — J301 Allergic rhinitis due to pollen: Secondary | ICD-10-CM | POA: Diagnosis not present

## 2022-12-27 DIAGNOSIS — J3081 Allergic rhinitis due to animal (cat) (dog) hair and dander: Secondary | ICD-10-CM | POA: Diagnosis not present

## 2022-12-27 DIAGNOSIS — J3089 Other allergic rhinitis: Secondary | ICD-10-CM | POA: Diagnosis not present

## 2022-12-28 DIAGNOSIS — F330 Major depressive disorder, recurrent, mild: Secondary | ICD-10-CM | POA: Diagnosis not present

## 2023-01-03 DIAGNOSIS — J301 Allergic rhinitis due to pollen: Secondary | ICD-10-CM | POA: Diagnosis not present

## 2023-01-03 DIAGNOSIS — J3089 Other allergic rhinitis: Secondary | ICD-10-CM | POA: Diagnosis not present

## 2023-01-03 DIAGNOSIS — J3081 Allergic rhinitis due to animal (cat) (dog) hair and dander: Secondary | ICD-10-CM | POA: Diagnosis not present

## 2023-01-10 DIAGNOSIS — J301 Allergic rhinitis due to pollen: Secondary | ICD-10-CM | POA: Diagnosis not present

## 2023-01-10 DIAGNOSIS — J3089 Other allergic rhinitis: Secondary | ICD-10-CM | POA: Diagnosis not present

## 2023-01-10 DIAGNOSIS — J3081 Allergic rhinitis due to animal (cat) (dog) hair and dander: Secondary | ICD-10-CM | POA: Diagnosis not present

## 2023-01-13 IMAGING — US US THYROID
1 series · 14 of 25 positions shown · non-contrast
Comparison: None.

CLINICAL DATA: Family history of thyroid cancer

EXAM:
THYROID ULTRASOUND
TECHNIQUE: Ultrasound examination of the thyroid gland and adjacent soft
tissues was performed.

[Series 1: us thyroid · 0.06mm/px · 53 acquisitions, 14 frames shown]
[im 1/53]
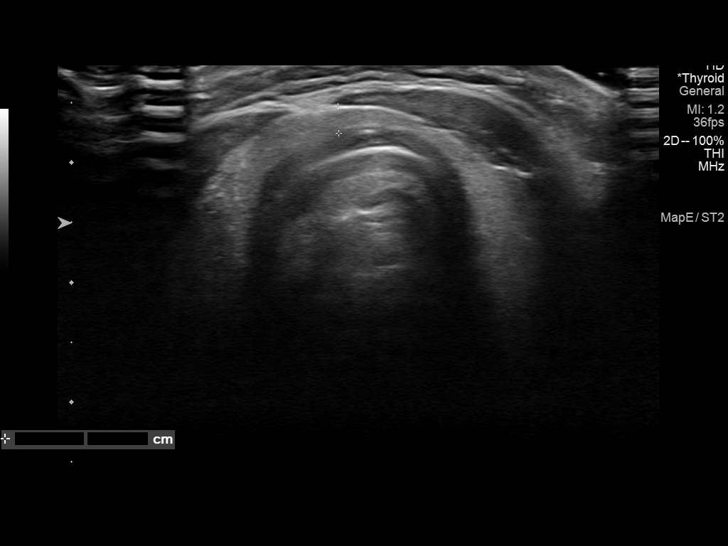
[im 5/53]
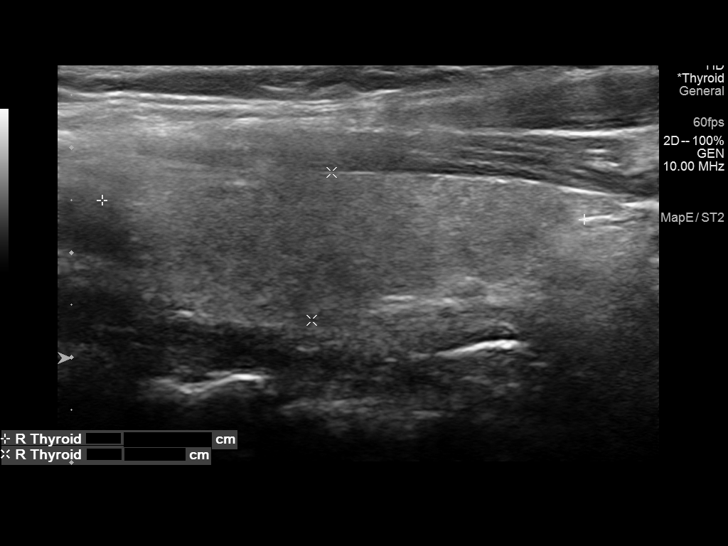
[im 9/53]
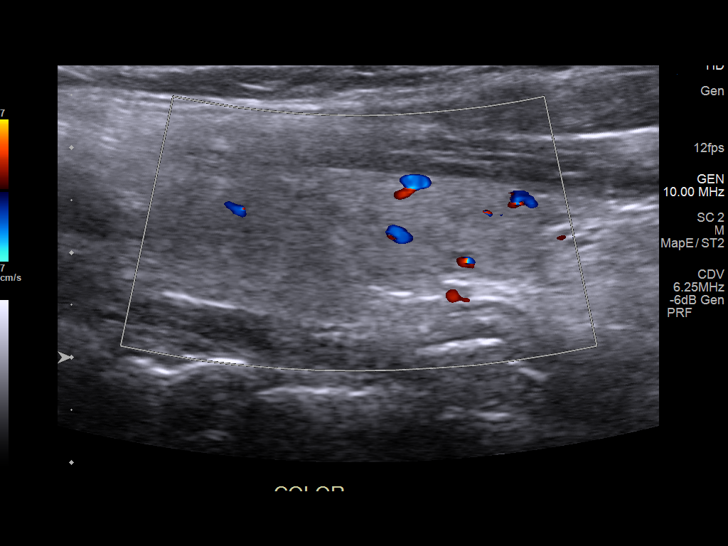
[im 14/53]
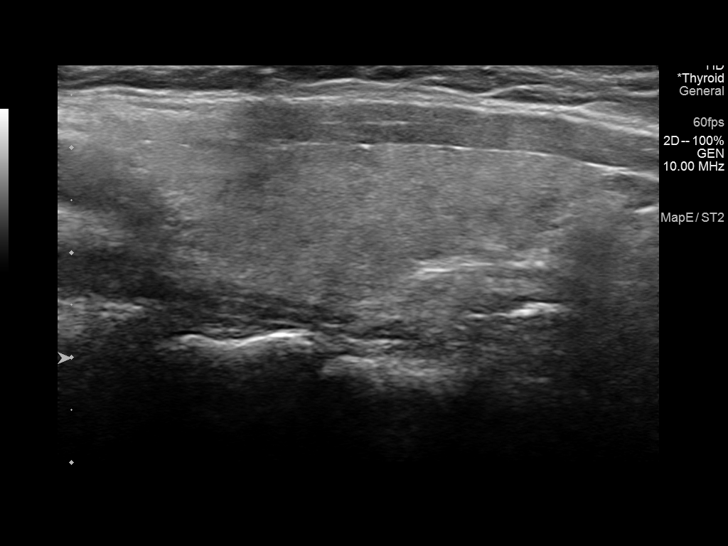
[im 18/53]
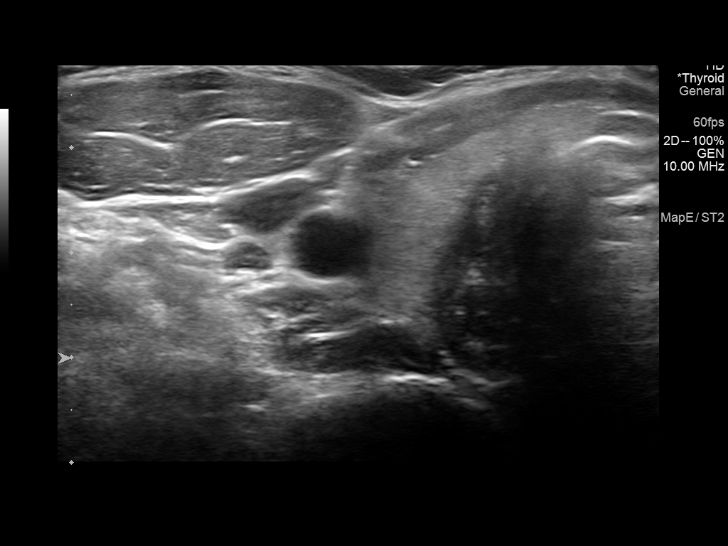
[im 20/53]
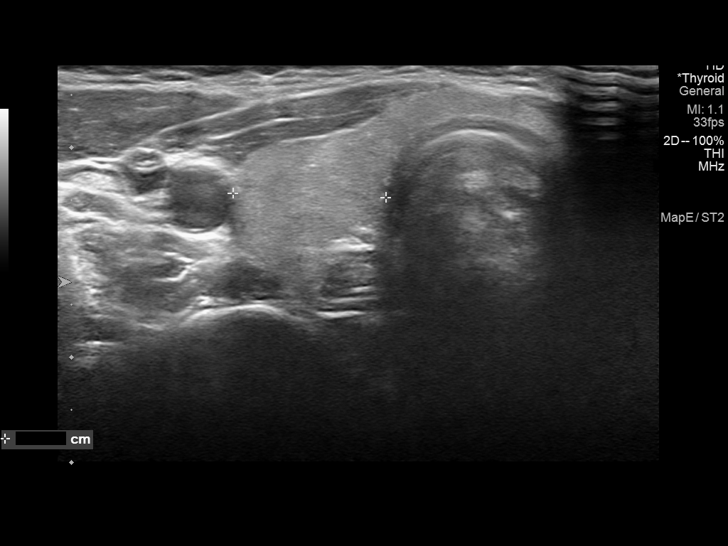
[im 24/53]
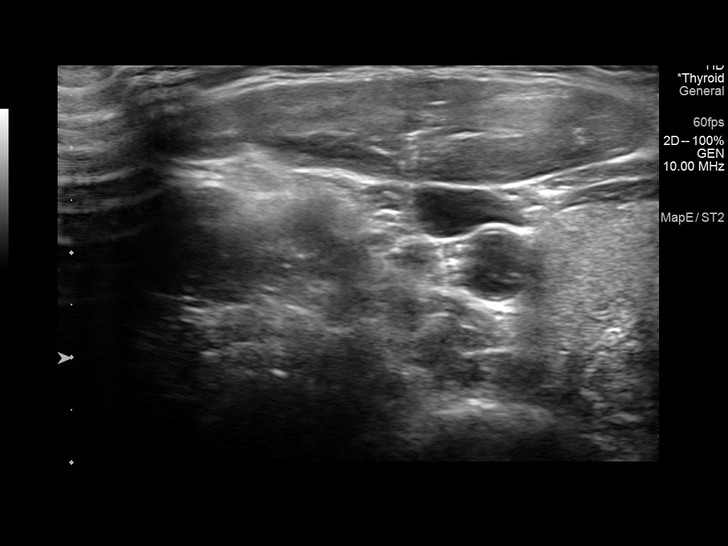
[im 29/53]
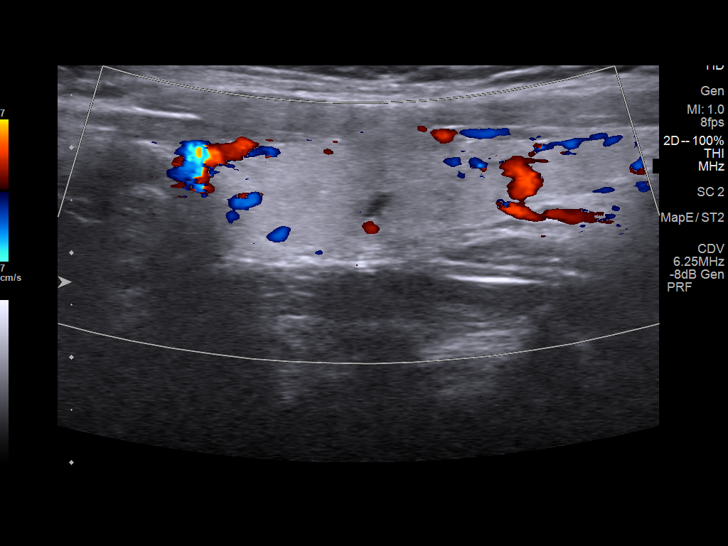
[im 33/53]
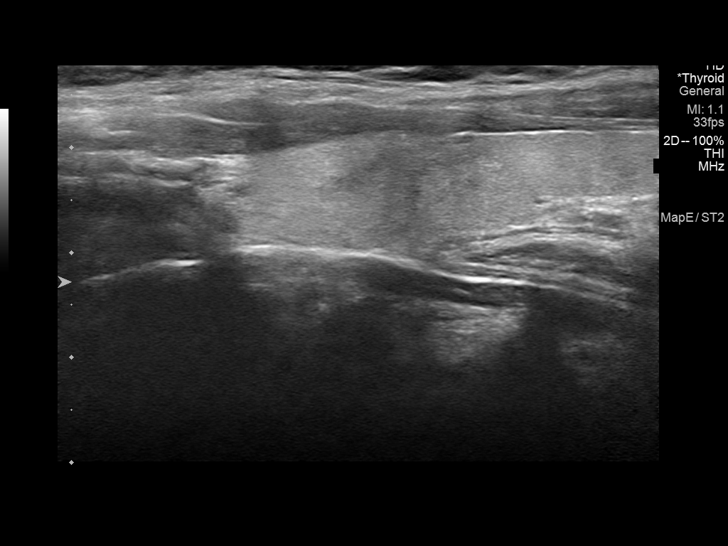
[im 35/53]
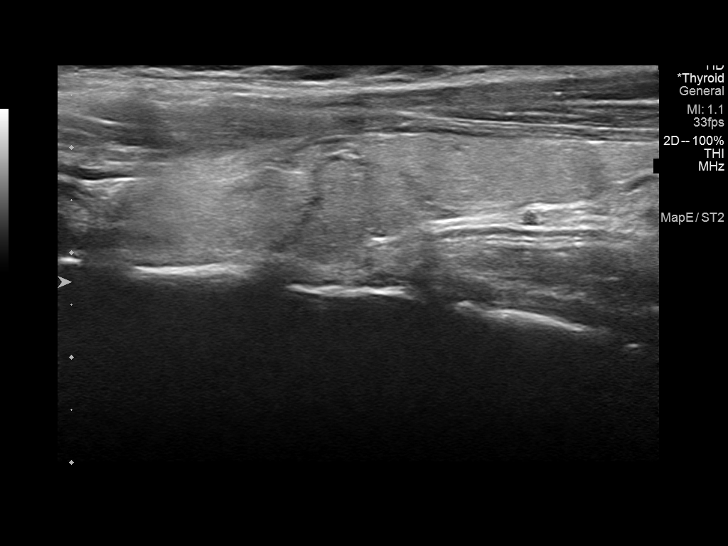
[im 40/53]
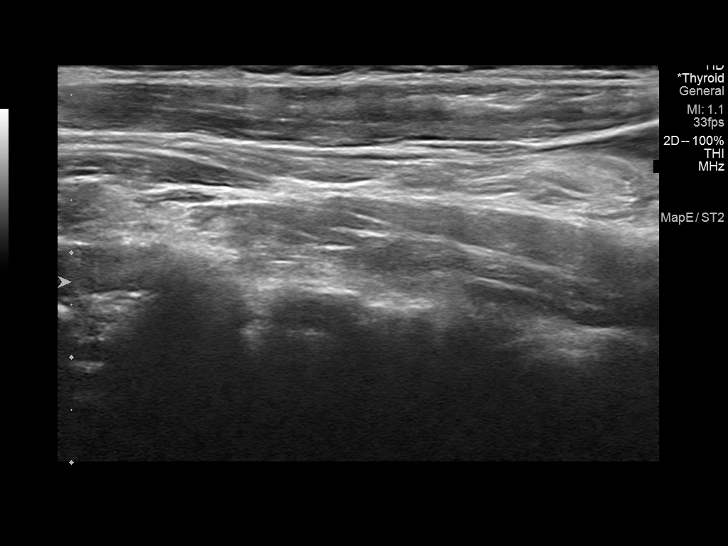
[im 44/53]
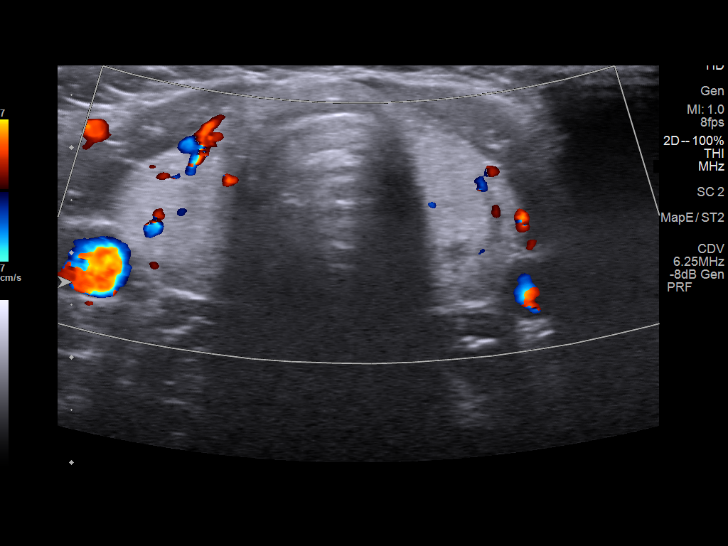
[im 48/53]
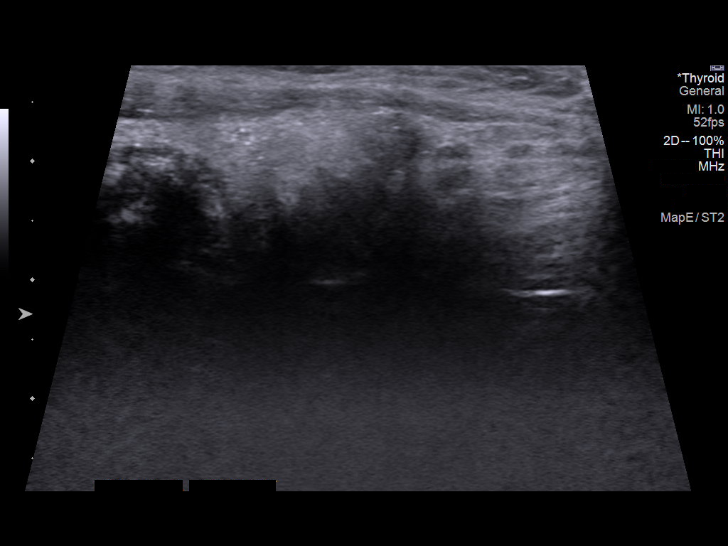
[im 53/53]
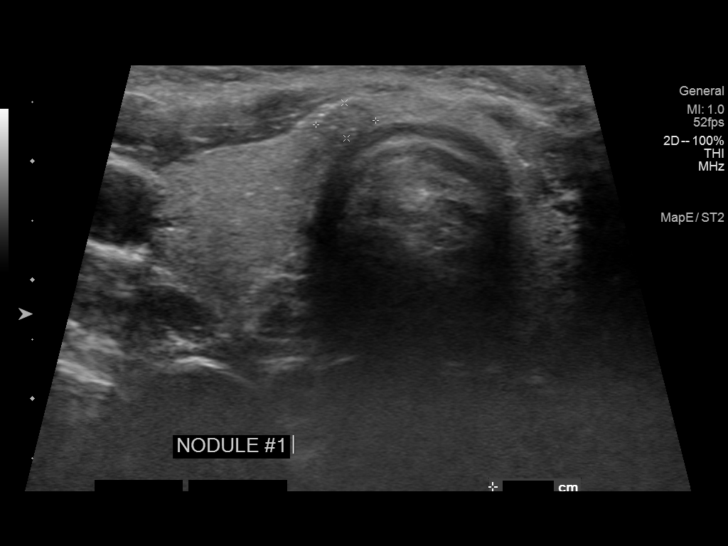

[14 of 25 positions shown; findings below may reference images not displayed]

FINDINGS: Parenchymal Echotexture: Normal

Isthmus: 2 mm

Right lobe:  4.6 x 1.4 x 1.6 cm

Left lobe: 5.5 x 1.2 x 1.5 cm

_________________________________________________________

Estimated total number of nodules >/= 1 cm: 0

Number of spongiform nodules >/=  2 cm not described below (TR1): 0

Number of mixed cystic and solid nodules >/= 1.5 cm not described
below (TR2): 0

_________________________________________________________

Normal background thyroid echotexture without hypervascularity.

Subcentimeter right isthmus hypoechoic TR 4 type nodule measures
only 6 mm. This would not meet criteria for any biopsy or follow-up
and is not fully described by TI rads criteria. No other significant
thyroid abnormality. No regional adenopathy.
IMPRESSION: Subcentimeter right isthmus hypoechoic nodule, 6 mm.

No other significant finding by ultrasound.

The above is in keeping with the ACR TI-RADS recommendations - [HOSPITAL] 2855;[DATE].

## 2023-01-17 DIAGNOSIS — J3089 Other allergic rhinitis: Secondary | ICD-10-CM | POA: Diagnosis not present

## 2023-01-17 DIAGNOSIS — J301 Allergic rhinitis due to pollen: Secondary | ICD-10-CM | POA: Diagnosis not present

## 2023-01-17 DIAGNOSIS — J3081 Allergic rhinitis due to animal (cat) (dog) hair and dander: Secondary | ICD-10-CM | POA: Diagnosis not present

## 2023-01-25 DIAGNOSIS — J3081 Allergic rhinitis due to animal (cat) (dog) hair and dander: Secondary | ICD-10-CM | POA: Diagnosis not present

## 2023-01-25 DIAGNOSIS — J3089 Other allergic rhinitis: Secondary | ICD-10-CM | POA: Diagnosis not present

## 2023-01-25 DIAGNOSIS — J301 Allergic rhinitis due to pollen: Secondary | ICD-10-CM | POA: Diagnosis not present

## 2023-02-02 DIAGNOSIS — J3089 Other allergic rhinitis: Secondary | ICD-10-CM | POA: Diagnosis not present

## 2023-02-02 DIAGNOSIS — J3081 Allergic rhinitis due to animal (cat) (dog) hair and dander: Secondary | ICD-10-CM | POA: Diagnosis not present

## 2023-02-02 DIAGNOSIS — J301 Allergic rhinitis due to pollen: Secondary | ICD-10-CM | POA: Diagnosis not present

## 2023-02-03 DIAGNOSIS — F330 Major depressive disorder, recurrent, mild: Secondary | ICD-10-CM | POA: Diagnosis not present

## 2023-02-09 DIAGNOSIS — J3089 Other allergic rhinitis: Secondary | ICD-10-CM | POA: Diagnosis not present

## 2023-02-09 DIAGNOSIS — J301 Allergic rhinitis due to pollen: Secondary | ICD-10-CM | POA: Diagnosis not present

## 2023-02-09 DIAGNOSIS — J3081 Allergic rhinitis due to animal (cat) (dog) hair and dander: Secondary | ICD-10-CM | POA: Diagnosis not present

## 2023-02-16 DIAGNOSIS — J301 Allergic rhinitis due to pollen: Secondary | ICD-10-CM | POA: Diagnosis not present

## 2023-02-16 DIAGNOSIS — D225 Melanocytic nevi of trunk: Secondary | ICD-10-CM | POA: Diagnosis not present

## 2023-02-16 DIAGNOSIS — J3081 Allergic rhinitis due to animal (cat) (dog) hair and dander: Secondary | ICD-10-CM | POA: Diagnosis not present

## 2023-02-16 DIAGNOSIS — L815 Leukoderma, not elsewhere classified: Secondary | ICD-10-CM | POA: Diagnosis not present

## 2023-02-16 DIAGNOSIS — J3089 Other allergic rhinitis: Secondary | ICD-10-CM | POA: Diagnosis not present

## 2023-02-16 DIAGNOSIS — L814 Other melanin hyperpigmentation: Secondary | ICD-10-CM | POA: Diagnosis not present

## 2023-02-16 DIAGNOSIS — L578 Other skin changes due to chronic exposure to nonionizing radiation: Secondary | ICD-10-CM | POA: Diagnosis not present

## 2023-02-20 DIAGNOSIS — M9904 Segmental and somatic dysfunction of sacral region: Secondary | ICD-10-CM | POA: Diagnosis not present

## 2023-02-20 DIAGNOSIS — M9902 Segmental and somatic dysfunction of thoracic region: Secondary | ICD-10-CM | POA: Diagnosis not present

## 2023-02-20 DIAGNOSIS — M9901 Segmental and somatic dysfunction of cervical region: Secondary | ICD-10-CM | POA: Diagnosis not present

## 2023-02-20 DIAGNOSIS — M9907 Segmental and somatic dysfunction of upper extremity: Secondary | ICD-10-CM | POA: Diagnosis not present

## 2023-02-20 DIAGNOSIS — M531 Cervicobrachial syndrome: Secondary | ICD-10-CM | POA: Diagnosis not present

## 2023-02-20 DIAGNOSIS — M9903 Segmental and somatic dysfunction of lumbar region: Secondary | ICD-10-CM | POA: Diagnosis not present

## 2023-02-22 DIAGNOSIS — J301 Allergic rhinitis due to pollen: Secondary | ICD-10-CM | POA: Diagnosis not present

## 2023-02-22 DIAGNOSIS — J3089 Other allergic rhinitis: Secondary | ICD-10-CM | POA: Diagnosis not present

## 2023-02-22 DIAGNOSIS — J3081 Allergic rhinitis due to animal (cat) (dog) hair and dander: Secondary | ICD-10-CM | POA: Diagnosis not present

## 2023-02-28 DIAGNOSIS — M9904 Segmental and somatic dysfunction of sacral region: Secondary | ICD-10-CM | POA: Diagnosis not present

## 2023-02-28 DIAGNOSIS — M9901 Segmental and somatic dysfunction of cervical region: Secondary | ICD-10-CM | POA: Diagnosis not present

## 2023-02-28 DIAGNOSIS — M9903 Segmental and somatic dysfunction of lumbar region: Secondary | ICD-10-CM | POA: Diagnosis not present

## 2023-02-28 DIAGNOSIS — M9902 Segmental and somatic dysfunction of thoracic region: Secondary | ICD-10-CM | POA: Diagnosis not present

## 2023-02-28 DIAGNOSIS — M531 Cervicobrachial syndrome: Secondary | ICD-10-CM | POA: Diagnosis not present

## 2023-03-01 ENCOUNTER — Other Ambulatory Visit: Payer: Self-pay | Admitting: Physician Assistant

## 2023-03-07 DIAGNOSIS — M9901 Segmental and somatic dysfunction of cervical region: Secondary | ICD-10-CM | POA: Diagnosis not present

## 2023-03-07 DIAGNOSIS — M531 Cervicobrachial syndrome: Secondary | ICD-10-CM | POA: Diagnosis not present

## 2023-03-07 DIAGNOSIS — M9907 Segmental and somatic dysfunction of upper extremity: Secondary | ICD-10-CM | POA: Diagnosis not present

## 2023-03-07 DIAGNOSIS — M9903 Segmental and somatic dysfunction of lumbar region: Secondary | ICD-10-CM | POA: Diagnosis not present

## 2023-03-07 DIAGNOSIS — M9902 Segmental and somatic dysfunction of thoracic region: Secondary | ICD-10-CM | POA: Diagnosis not present

## 2023-03-07 DIAGNOSIS — M9904 Segmental and somatic dysfunction of sacral region: Secondary | ICD-10-CM | POA: Diagnosis not present

## 2023-03-08 DIAGNOSIS — J3081 Allergic rhinitis due to animal (cat) (dog) hair and dander: Secondary | ICD-10-CM | POA: Diagnosis not present

## 2023-03-08 DIAGNOSIS — J301 Allergic rhinitis due to pollen: Secondary | ICD-10-CM | POA: Diagnosis not present

## 2023-03-08 DIAGNOSIS — J3089 Other allergic rhinitis: Secondary | ICD-10-CM | POA: Diagnosis not present

## 2023-03-15 DIAGNOSIS — J3089 Other allergic rhinitis: Secondary | ICD-10-CM | POA: Diagnosis not present

## 2023-03-15 DIAGNOSIS — J3081 Allergic rhinitis due to animal (cat) (dog) hair and dander: Secondary | ICD-10-CM | POA: Diagnosis not present

## 2023-03-15 DIAGNOSIS — J301 Allergic rhinitis due to pollen: Secondary | ICD-10-CM | POA: Diagnosis not present

## 2023-03-22 DIAGNOSIS — M9901 Segmental and somatic dysfunction of cervical region: Secondary | ICD-10-CM | POA: Diagnosis not present

## 2023-03-22 DIAGNOSIS — M9904 Segmental and somatic dysfunction of sacral region: Secondary | ICD-10-CM | POA: Diagnosis not present

## 2023-03-22 DIAGNOSIS — M9903 Segmental and somatic dysfunction of lumbar region: Secondary | ICD-10-CM | POA: Diagnosis not present

## 2023-03-22 DIAGNOSIS — M531 Cervicobrachial syndrome: Secondary | ICD-10-CM | POA: Diagnosis not present

## 2023-03-22 DIAGNOSIS — M9902 Segmental and somatic dysfunction of thoracic region: Secondary | ICD-10-CM | POA: Diagnosis not present

## 2023-03-24 DIAGNOSIS — J3089 Other allergic rhinitis: Secondary | ICD-10-CM | POA: Diagnosis not present

## 2023-03-24 DIAGNOSIS — J3081 Allergic rhinitis due to animal (cat) (dog) hair and dander: Secondary | ICD-10-CM | POA: Diagnosis not present

## 2023-03-24 DIAGNOSIS — J301 Allergic rhinitis due to pollen: Secondary | ICD-10-CM | POA: Diagnosis not present

## 2023-04-05 DIAGNOSIS — J3081 Allergic rhinitis due to animal (cat) (dog) hair and dander: Secondary | ICD-10-CM | POA: Diagnosis not present

## 2023-04-05 DIAGNOSIS — J301 Allergic rhinitis due to pollen: Secondary | ICD-10-CM | POA: Diagnosis not present

## 2023-04-05 DIAGNOSIS — J3089 Other allergic rhinitis: Secondary | ICD-10-CM | POA: Diagnosis not present

## 2023-04-12 DIAGNOSIS — M9904 Segmental and somatic dysfunction of sacral region: Secondary | ICD-10-CM | POA: Diagnosis not present

## 2023-04-12 DIAGNOSIS — M9907 Segmental and somatic dysfunction of upper extremity: Secondary | ICD-10-CM | POA: Diagnosis not present

## 2023-04-12 DIAGNOSIS — M9901 Segmental and somatic dysfunction of cervical region: Secondary | ICD-10-CM | POA: Diagnosis not present

## 2023-04-12 DIAGNOSIS — M531 Cervicobrachial syndrome: Secondary | ICD-10-CM | POA: Diagnosis not present

## 2023-04-12 DIAGNOSIS — M9903 Segmental and somatic dysfunction of lumbar region: Secondary | ICD-10-CM | POA: Diagnosis not present

## 2023-04-13 DIAGNOSIS — F330 Major depressive disorder, recurrent, mild: Secondary | ICD-10-CM | POA: Diagnosis not present

## 2023-04-25 DIAGNOSIS — J301 Allergic rhinitis due to pollen: Secondary | ICD-10-CM | POA: Diagnosis not present

## 2023-04-25 DIAGNOSIS — J3089 Other allergic rhinitis: Secondary | ICD-10-CM | POA: Diagnosis not present

## 2023-04-25 DIAGNOSIS — J3081 Allergic rhinitis due to animal (cat) (dog) hair and dander: Secondary | ICD-10-CM | POA: Diagnosis not present

## 2023-04-26 DIAGNOSIS — M531 Cervicobrachial syndrome: Secondary | ICD-10-CM | POA: Diagnosis not present

## 2023-04-26 DIAGNOSIS — M9907 Segmental and somatic dysfunction of upper extremity: Secondary | ICD-10-CM | POA: Diagnosis not present

## 2023-04-26 DIAGNOSIS — M9901 Segmental and somatic dysfunction of cervical region: Secondary | ICD-10-CM | POA: Diagnosis not present

## 2023-04-26 DIAGNOSIS — M9904 Segmental and somatic dysfunction of sacral region: Secondary | ICD-10-CM | POA: Diagnosis not present

## 2023-04-26 DIAGNOSIS — M9903 Segmental and somatic dysfunction of lumbar region: Secondary | ICD-10-CM | POA: Diagnosis not present

## 2023-05-15 DIAGNOSIS — J3081 Allergic rhinitis due to animal (cat) (dog) hair and dander: Secondary | ICD-10-CM | POA: Diagnosis not present

## 2023-05-15 DIAGNOSIS — J301 Allergic rhinitis due to pollen: Secondary | ICD-10-CM | POA: Diagnosis not present

## 2023-05-15 DIAGNOSIS — J3089 Other allergic rhinitis: Secondary | ICD-10-CM | POA: Diagnosis not present

## 2023-05-16 DIAGNOSIS — F330 Major depressive disorder, recurrent, mild: Secondary | ICD-10-CM | POA: Diagnosis not present

## 2023-05-24 DIAGNOSIS — M9903 Segmental and somatic dysfunction of lumbar region: Secondary | ICD-10-CM | POA: Diagnosis not present

## 2023-05-24 DIAGNOSIS — M531 Cervicobrachial syndrome: Secondary | ICD-10-CM | POA: Diagnosis not present

## 2023-05-24 DIAGNOSIS — M9902 Segmental and somatic dysfunction of thoracic region: Secondary | ICD-10-CM | POA: Diagnosis not present

## 2023-05-24 DIAGNOSIS — M9901 Segmental and somatic dysfunction of cervical region: Secondary | ICD-10-CM | POA: Diagnosis not present

## 2023-05-24 DIAGNOSIS — M9904 Segmental and somatic dysfunction of sacral region: Secondary | ICD-10-CM | POA: Diagnosis not present

## 2023-05-24 DIAGNOSIS — M9907 Segmental and somatic dysfunction of upper extremity: Secondary | ICD-10-CM | POA: Diagnosis not present

## 2023-05-30 DIAGNOSIS — J3089 Other allergic rhinitis: Secondary | ICD-10-CM | POA: Diagnosis not present

## 2023-05-30 DIAGNOSIS — J3081 Allergic rhinitis due to animal (cat) (dog) hair and dander: Secondary | ICD-10-CM | POA: Diagnosis not present

## 2023-05-30 DIAGNOSIS — J301 Allergic rhinitis due to pollen: Secondary | ICD-10-CM | POA: Diagnosis not present

## 2023-06-05 DIAGNOSIS — M9901 Segmental and somatic dysfunction of cervical region: Secondary | ICD-10-CM | POA: Diagnosis not present

## 2023-06-05 DIAGNOSIS — M531 Cervicobrachial syndrome: Secondary | ICD-10-CM | POA: Diagnosis not present

## 2023-06-05 DIAGNOSIS — M9903 Segmental and somatic dysfunction of lumbar region: Secondary | ICD-10-CM | POA: Diagnosis not present

## 2023-06-05 DIAGNOSIS — M9904 Segmental and somatic dysfunction of sacral region: Secondary | ICD-10-CM | POA: Diagnosis not present

## 2023-06-05 DIAGNOSIS — M9907 Segmental and somatic dysfunction of upper extremity: Secondary | ICD-10-CM | POA: Diagnosis not present

## 2023-06-21 DIAGNOSIS — M531 Cervicobrachial syndrome: Secondary | ICD-10-CM | POA: Diagnosis not present

## 2023-06-21 DIAGNOSIS — M9903 Segmental and somatic dysfunction of lumbar region: Secondary | ICD-10-CM | POA: Diagnosis not present

## 2023-06-21 DIAGNOSIS — M9907 Segmental and somatic dysfunction of upper extremity: Secondary | ICD-10-CM | POA: Diagnosis not present

## 2023-06-21 DIAGNOSIS — M9904 Segmental and somatic dysfunction of sacral region: Secondary | ICD-10-CM | POA: Diagnosis not present

## 2023-06-21 DIAGNOSIS — M9902 Segmental and somatic dysfunction of thoracic region: Secondary | ICD-10-CM | POA: Diagnosis not present

## 2023-06-21 DIAGNOSIS — M9901 Segmental and somatic dysfunction of cervical region: Secondary | ICD-10-CM | POA: Diagnosis not present

## 2023-07-05 DIAGNOSIS — J301 Allergic rhinitis due to pollen: Secondary | ICD-10-CM | POA: Diagnosis not present

## 2023-07-05 DIAGNOSIS — M9903 Segmental and somatic dysfunction of lumbar region: Secondary | ICD-10-CM | POA: Diagnosis not present

## 2023-07-05 DIAGNOSIS — F330 Major depressive disorder, recurrent, mild: Secondary | ICD-10-CM | POA: Diagnosis not present

## 2023-07-05 DIAGNOSIS — M9907 Segmental and somatic dysfunction of upper extremity: Secondary | ICD-10-CM | POA: Diagnosis not present

## 2023-07-05 DIAGNOSIS — J3089 Other allergic rhinitis: Secondary | ICD-10-CM | POA: Diagnosis not present

## 2023-07-05 DIAGNOSIS — M9902 Segmental and somatic dysfunction of thoracic region: Secondary | ICD-10-CM | POA: Diagnosis not present

## 2023-07-05 DIAGNOSIS — M9904 Segmental and somatic dysfunction of sacral region: Secondary | ICD-10-CM | POA: Diagnosis not present

## 2023-07-05 DIAGNOSIS — M9901 Segmental and somatic dysfunction of cervical region: Secondary | ICD-10-CM | POA: Diagnosis not present

## 2023-07-05 DIAGNOSIS — J3081 Allergic rhinitis due to animal (cat) (dog) hair and dander: Secondary | ICD-10-CM | POA: Diagnosis not present

## 2023-07-05 DIAGNOSIS — M531 Cervicobrachial syndrome: Secondary | ICD-10-CM | POA: Diagnosis not present

## 2023-07-11 DIAGNOSIS — J301 Allergic rhinitis due to pollen: Secondary | ICD-10-CM | POA: Diagnosis not present

## 2023-07-11 DIAGNOSIS — J3089 Other allergic rhinitis: Secondary | ICD-10-CM | POA: Diagnosis not present

## 2023-07-11 DIAGNOSIS — J3081 Allergic rhinitis due to animal (cat) (dog) hair and dander: Secondary | ICD-10-CM | POA: Diagnosis not present

## 2023-07-19 DIAGNOSIS — J301 Allergic rhinitis due to pollen: Secondary | ICD-10-CM | POA: Diagnosis not present

## 2023-07-19 DIAGNOSIS — J3089 Other allergic rhinitis: Secondary | ICD-10-CM | POA: Diagnosis not present

## 2023-07-19 DIAGNOSIS — J3081 Allergic rhinitis due to animal (cat) (dog) hair and dander: Secondary | ICD-10-CM | POA: Diagnosis not present

## 2023-07-24 DIAGNOSIS — M9907 Segmental and somatic dysfunction of upper extremity: Secondary | ICD-10-CM | POA: Diagnosis not present

## 2023-07-24 DIAGNOSIS — M531 Cervicobrachial syndrome: Secondary | ICD-10-CM | POA: Diagnosis not present

## 2023-07-24 DIAGNOSIS — M9902 Segmental and somatic dysfunction of thoracic region: Secondary | ICD-10-CM | POA: Diagnosis not present

## 2023-07-24 DIAGNOSIS — M9901 Segmental and somatic dysfunction of cervical region: Secondary | ICD-10-CM | POA: Diagnosis not present

## 2023-07-24 DIAGNOSIS — M9903 Segmental and somatic dysfunction of lumbar region: Secondary | ICD-10-CM | POA: Diagnosis not present

## 2023-07-24 DIAGNOSIS — M9904 Segmental and somatic dysfunction of sacral region: Secondary | ICD-10-CM | POA: Diagnosis not present

## 2023-07-26 DIAGNOSIS — J3081 Allergic rhinitis due to animal (cat) (dog) hair and dander: Secondary | ICD-10-CM | POA: Diagnosis not present

## 2023-07-26 DIAGNOSIS — J301 Allergic rhinitis due to pollen: Secondary | ICD-10-CM | POA: Diagnosis not present

## 2023-07-26 DIAGNOSIS — J3089 Other allergic rhinitis: Secondary | ICD-10-CM | POA: Diagnosis not present

## 2023-08-02 DIAGNOSIS — J301 Allergic rhinitis due to pollen: Secondary | ICD-10-CM | POA: Diagnosis not present

## 2023-08-02 DIAGNOSIS — J3089 Other allergic rhinitis: Secondary | ICD-10-CM | POA: Diagnosis not present

## 2023-08-02 DIAGNOSIS — J3081 Allergic rhinitis due to animal (cat) (dog) hair and dander: Secondary | ICD-10-CM | POA: Diagnosis not present

## 2023-08-09 DIAGNOSIS — J3081 Allergic rhinitis due to animal (cat) (dog) hair and dander: Secondary | ICD-10-CM | POA: Diagnosis not present

## 2023-08-09 DIAGNOSIS — J3089 Other allergic rhinitis: Secondary | ICD-10-CM | POA: Diagnosis not present

## 2023-08-09 DIAGNOSIS — J301 Allergic rhinitis due to pollen: Secondary | ICD-10-CM | POA: Diagnosis not present

## 2023-08-16 DIAGNOSIS — M9902 Segmental and somatic dysfunction of thoracic region: Secondary | ICD-10-CM | POA: Diagnosis not present

## 2023-08-16 DIAGNOSIS — M9904 Segmental and somatic dysfunction of sacral region: Secondary | ICD-10-CM | POA: Diagnosis not present

## 2023-08-16 DIAGNOSIS — M9901 Segmental and somatic dysfunction of cervical region: Secondary | ICD-10-CM | POA: Diagnosis not present

## 2023-08-16 DIAGNOSIS — M9903 Segmental and somatic dysfunction of lumbar region: Secondary | ICD-10-CM | POA: Diagnosis not present

## 2023-08-16 DIAGNOSIS — M9907 Segmental and somatic dysfunction of upper extremity: Secondary | ICD-10-CM | POA: Diagnosis not present

## 2023-08-16 DIAGNOSIS — M531 Cervicobrachial syndrome: Secondary | ICD-10-CM | POA: Diagnosis not present

## 2023-08-18 DIAGNOSIS — J3089 Other allergic rhinitis: Secondary | ICD-10-CM | POA: Diagnosis not present

## 2023-08-18 DIAGNOSIS — J3081 Allergic rhinitis due to animal (cat) (dog) hair and dander: Secondary | ICD-10-CM | POA: Diagnosis not present

## 2023-08-18 DIAGNOSIS — J301 Allergic rhinitis due to pollen: Secondary | ICD-10-CM | POA: Diagnosis not present

## 2023-08-29 DIAGNOSIS — Z01419 Encounter for gynecological examination (general) (routine) without abnormal findings: Secondary | ICD-10-CM | POA: Diagnosis not present

## 2023-08-29 DIAGNOSIS — Z1231 Encounter for screening mammogram for malignant neoplasm of breast: Secondary | ICD-10-CM | POA: Diagnosis not present

## 2023-08-29 DIAGNOSIS — J3089 Other allergic rhinitis: Secondary | ICD-10-CM | POA: Diagnosis not present

## 2023-08-29 DIAGNOSIS — Z1331 Encounter for screening for depression: Secondary | ICD-10-CM | POA: Diagnosis not present

## 2023-08-29 DIAGNOSIS — J301 Allergic rhinitis due to pollen: Secondary | ICD-10-CM | POA: Diagnosis not present

## 2023-08-29 DIAGNOSIS — J3081 Allergic rhinitis due to animal (cat) (dog) hair and dander: Secondary | ICD-10-CM | POA: Diagnosis not present

## 2023-08-29 LAB — HM MAMMOGRAPHY

## 2023-08-30 DIAGNOSIS — M9903 Segmental and somatic dysfunction of lumbar region: Secondary | ICD-10-CM | POA: Diagnosis not present

## 2023-08-30 DIAGNOSIS — M9907 Segmental and somatic dysfunction of upper extremity: Secondary | ICD-10-CM | POA: Diagnosis not present

## 2023-08-30 DIAGNOSIS — M9902 Segmental and somatic dysfunction of thoracic region: Secondary | ICD-10-CM | POA: Diagnosis not present

## 2023-08-30 DIAGNOSIS — M9901 Segmental and somatic dysfunction of cervical region: Secondary | ICD-10-CM | POA: Diagnosis not present

## 2023-08-30 DIAGNOSIS — M531 Cervicobrachial syndrome: Secondary | ICD-10-CM | POA: Diagnosis not present

## 2023-08-30 DIAGNOSIS — M9904 Segmental and somatic dysfunction of sacral region: Secondary | ICD-10-CM | POA: Diagnosis not present

## 2023-09-06 DIAGNOSIS — F330 Major depressive disorder, recurrent, mild: Secondary | ICD-10-CM | POA: Diagnosis not present

## 2023-09-07 DIAGNOSIS — J3081 Allergic rhinitis due to animal (cat) (dog) hair and dander: Secondary | ICD-10-CM | POA: Diagnosis not present

## 2023-09-07 DIAGNOSIS — J4520 Mild intermittent asthma, uncomplicated: Secondary | ICD-10-CM | POA: Diagnosis not present

## 2023-09-07 DIAGNOSIS — J3089 Other allergic rhinitis: Secondary | ICD-10-CM | POA: Diagnosis not present

## 2023-09-07 DIAGNOSIS — J301 Allergic rhinitis due to pollen: Secondary | ICD-10-CM | POA: Diagnosis not present

## 2023-09-13 DIAGNOSIS — J3089 Other allergic rhinitis: Secondary | ICD-10-CM | POA: Diagnosis not present

## 2023-09-13 DIAGNOSIS — J3081 Allergic rhinitis due to animal (cat) (dog) hair and dander: Secondary | ICD-10-CM | POA: Diagnosis not present

## 2023-09-13 DIAGNOSIS — J301 Allergic rhinitis due to pollen: Secondary | ICD-10-CM | POA: Diagnosis not present

## 2023-09-15 DIAGNOSIS — J3081 Allergic rhinitis due to animal (cat) (dog) hair and dander: Secondary | ICD-10-CM | POA: Diagnosis not present

## 2023-09-15 DIAGNOSIS — J301 Allergic rhinitis due to pollen: Secondary | ICD-10-CM | POA: Diagnosis not present

## 2023-09-18 DIAGNOSIS — J3089 Other allergic rhinitis: Secondary | ICD-10-CM | POA: Diagnosis not present

## 2023-09-20 DIAGNOSIS — M9904 Segmental and somatic dysfunction of sacral region: Secondary | ICD-10-CM | POA: Diagnosis not present

## 2023-09-20 DIAGNOSIS — M9907 Segmental and somatic dysfunction of upper extremity: Secondary | ICD-10-CM | POA: Diagnosis not present

## 2023-09-20 DIAGNOSIS — M531 Cervicobrachial syndrome: Secondary | ICD-10-CM | POA: Diagnosis not present

## 2023-09-20 DIAGNOSIS — M9902 Segmental and somatic dysfunction of thoracic region: Secondary | ICD-10-CM | POA: Diagnosis not present

## 2023-09-20 DIAGNOSIS — M9901 Segmental and somatic dysfunction of cervical region: Secondary | ICD-10-CM | POA: Diagnosis not present

## 2023-09-20 DIAGNOSIS — M9903 Segmental and somatic dysfunction of lumbar region: Secondary | ICD-10-CM | POA: Diagnosis not present

## 2023-09-27 DIAGNOSIS — J3089 Other allergic rhinitis: Secondary | ICD-10-CM | POA: Diagnosis not present

## 2023-09-27 DIAGNOSIS — J301 Allergic rhinitis due to pollen: Secondary | ICD-10-CM | POA: Diagnosis not present

## 2023-09-27 DIAGNOSIS — J3081 Allergic rhinitis due to animal (cat) (dog) hair and dander: Secondary | ICD-10-CM | POA: Diagnosis not present

## 2023-10-11 DIAGNOSIS — M9903 Segmental and somatic dysfunction of lumbar region: Secondary | ICD-10-CM | POA: Diagnosis not present

## 2023-10-11 DIAGNOSIS — M9901 Segmental and somatic dysfunction of cervical region: Secondary | ICD-10-CM | POA: Diagnosis not present

## 2023-10-11 DIAGNOSIS — M531 Cervicobrachial syndrome: Secondary | ICD-10-CM | POA: Diagnosis not present

## 2023-10-11 DIAGNOSIS — M9902 Segmental and somatic dysfunction of thoracic region: Secondary | ICD-10-CM | POA: Diagnosis not present

## 2023-12-05 DIAGNOSIS — J301 Allergic rhinitis due to pollen: Secondary | ICD-10-CM | POA: Diagnosis not present

## 2023-12-05 DIAGNOSIS — J3089 Other allergic rhinitis: Secondary | ICD-10-CM | POA: Diagnosis not present

## 2023-12-05 DIAGNOSIS — J3081 Allergic rhinitis due to animal (cat) (dog) hair and dander: Secondary | ICD-10-CM | POA: Diagnosis not present

## 2023-12-13 DIAGNOSIS — J3081 Allergic rhinitis due to animal (cat) (dog) hair and dander: Secondary | ICD-10-CM | POA: Diagnosis not present

## 2023-12-13 DIAGNOSIS — J301 Allergic rhinitis due to pollen: Secondary | ICD-10-CM | POA: Diagnosis not present

## 2023-12-13 DIAGNOSIS — J3089 Other allergic rhinitis: Secondary | ICD-10-CM | POA: Diagnosis not present

## 2023-12-20 DIAGNOSIS — J3081 Allergic rhinitis due to animal (cat) (dog) hair and dander: Secondary | ICD-10-CM | POA: Diagnosis not present

## 2023-12-20 DIAGNOSIS — J301 Allergic rhinitis due to pollen: Secondary | ICD-10-CM | POA: Diagnosis not present

## 2023-12-20 DIAGNOSIS — J3089 Other allergic rhinitis: Secondary | ICD-10-CM | POA: Diagnosis not present

## 2023-12-25 DIAGNOSIS — J3089 Other allergic rhinitis: Secondary | ICD-10-CM | POA: Diagnosis not present

## 2023-12-25 DIAGNOSIS — J3081 Allergic rhinitis due to animal (cat) (dog) hair and dander: Secondary | ICD-10-CM | POA: Diagnosis not present

## 2023-12-25 DIAGNOSIS — J301 Allergic rhinitis due to pollen: Secondary | ICD-10-CM | POA: Diagnosis not present

## 2024-01-03 DIAGNOSIS — J3089 Other allergic rhinitis: Secondary | ICD-10-CM | POA: Diagnosis not present

## 2024-01-03 DIAGNOSIS — J301 Allergic rhinitis due to pollen: Secondary | ICD-10-CM | POA: Diagnosis not present

## 2024-01-03 DIAGNOSIS — J3081 Allergic rhinitis due to animal (cat) (dog) hair and dander: Secondary | ICD-10-CM | POA: Diagnosis not present

## 2024-01-10 DIAGNOSIS — J3089 Other allergic rhinitis: Secondary | ICD-10-CM | POA: Diagnosis not present

## 2024-01-10 DIAGNOSIS — J3081 Allergic rhinitis due to animal (cat) (dog) hair and dander: Secondary | ICD-10-CM | POA: Diagnosis not present

## 2024-01-10 DIAGNOSIS — J301 Allergic rhinitis due to pollen: Secondary | ICD-10-CM | POA: Diagnosis not present

## 2024-01-17 DIAGNOSIS — J3089 Other allergic rhinitis: Secondary | ICD-10-CM | POA: Diagnosis not present

## 2024-01-17 DIAGNOSIS — J3081 Allergic rhinitis due to animal (cat) (dog) hair and dander: Secondary | ICD-10-CM | POA: Diagnosis not present

## 2024-01-17 DIAGNOSIS — J301 Allergic rhinitis due to pollen: Secondary | ICD-10-CM | POA: Diagnosis not present

## 2024-01-25 DIAGNOSIS — J301 Allergic rhinitis due to pollen: Secondary | ICD-10-CM | POA: Diagnosis not present

## 2024-01-25 DIAGNOSIS — J3081 Allergic rhinitis due to animal (cat) (dog) hair and dander: Secondary | ICD-10-CM | POA: Diagnosis not present

## 2024-01-25 DIAGNOSIS — J3089 Other allergic rhinitis: Secondary | ICD-10-CM | POA: Diagnosis not present

## 2024-02-01 DIAGNOSIS — J3081 Allergic rhinitis due to animal (cat) (dog) hair and dander: Secondary | ICD-10-CM | POA: Diagnosis not present

## 2024-02-01 DIAGNOSIS — J3089 Other allergic rhinitis: Secondary | ICD-10-CM | POA: Diagnosis not present

## 2024-02-01 DIAGNOSIS — J301 Allergic rhinitis due to pollen: Secondary | ICD-10-CM | POA: Diagnosis not present

## 2024-02-07 DIAGNOSIS — J3081 Allergic rhinitis due to animal (cat) (dog) hair and dander: Secondary | ICD-10-CM | POA: Diagnosis not present

## 2024-02-07 DIAGNOSIS — J3089 Other allergic rhinitis: Secondary | ICD-10-CM | POA: Diagnosis not present

## 2024-02-07 DIAGNOSIS — J301 Allergic rhinitis due to pollen: Secondary | ICD-10-CM | POA: Diagnosis not present

## 2024-02-13 DIAGNOSIS — J3089 Other allergic rhinitis: Secondary | ICD-10-CM | POA: Diagnosis not present

## 2024-02-13 DIAGNOSIS — J3081 Allergic rhinitis due to animal (cat) (dog) hair and dander: Secondary | ICD-10-CM | POA: Diagnosis not present

## 2024-02-13 DIAGNOSIS — J301 Allergic rhinitis due to pollen: Secondary | ICD-10-CM | POA: Diagnosis not present

## 2024-02-20 DIAGNOSIS — J3089 Other allergic rhinitis: Secondary | ICD-10-CM | POA: Diagnosis not present

## 2024-02-20 DIAGNOSIS — J301 Allergic rhinitis due to pollen: Secondary | ICD-10-CM | POA: Diagnosis not present

## 2024-02-20 DIAGNOSIS — J3081 Allergic rhinitis due to animal (cat) (dog) hair and dander: Secondary | ICD-10-CM | POA: Diagnosis not present

## 2024-02-27 DIAGNOSIS — J3089 Other allergic rhinitis: Secondary | ICD-10-CM | POA: Diagnosis not present

## 2024-02-27 DIAGNOSIS — J301 Allergic rhinitis due to pollen: Secondary | ICD-10-CM | POA: Diagnosis not present

## 2024-02-27 DIAGNOSIS — J3081 Allergic rhinitis due to animal (cat) (dog) hair and dander: Secondary | ICD-10-CM | POA: Diagnosis not present

## 2024-03-11 DIAGNOSIS — J3089 Other allergic rhinitis: Secondary | ICD-10-CM | POA: Diagnosis not present

## 2024-03-11 DIAGNOSIS — J301 Allergic rhinitis due to pollen: Secondary | ICD-10-CM | POA: Diagnosis not present

## 2024-03-11 DIAGNOSIS — J3081 Allergic rhinitis due to animal (cat) (dog) hair and dander: Secondary | ICD-10-CM | POA: Diagnosis not present

## 2024-03-19 DIAGNOSIS — J301 Allergic rhinitis due to pollen: Secondary | ICD-10-CM | POA: Diagnosis not present

## 2024-03-19 DIAGNOSIS — J3081 Allergic rhinitis due to animal (cat) (dog) hair and dander: Secondary | ICD-10-CM | POA: Diagnosis not present

## 2024-03-19 DIAGNOSIS — J3089 Other allergic rhinitis: Secondary | ICD-10-CM | POA: Diagnosis not present

## 2024-03-25 DIAGNOSIS — J301 Allergic rhinitis due to pollen: Secondary | ICD-10-CM | POA: Diagnosis not present

## 2024-03-25 DIAGNOSIS — J3089 Other allergic rhinitis: Secondary | ICD-10-CM | POA: Diagnosis not present

## 2024-03-25 DIAGNOSIS — J3081 Allergic rhinitis due to animal (cat) (dog) hair and dander: Secondary | ICD-10-CM | POA: Diagnosis not present

## 2024-04-09 DIAGNOSIS — J3081 Allergic rhinitis due to animal (cat) (dog) hair and dander: Secondary | ICD-10-CM | POA: Diagnosis not present

## 2024-04-09 DIAGNOSIS — J3089 Other allergic rhinitis: Secondary | ICD-10-CM | POA: Diagnosis not present

## 2024-04-09 DIAGNOSIS — J301 Allergic rhinitis due to pollen: Secondary | ICD-10-CM | POA: Diagnosis not present

## 2024-04-29 DIAGNOSIS — J4520 Mild intermittent asthma, uncomplicated: Secondary | ICD-10-CM | POA: Diagnosis not present

## 2024-04-29 DIAGNOSIS — J3089 Other allergic rhinitis: Secondary | ICD-10-CM | POA: Diagnosis not present

## 2024-04-29 DIAGNOSIS — J3081 Allergic rhinitis due to animal (cat) (dog) hair and dander: Secondary | ICD-10-CM | POA: Diagnosis not present

## 2024-04-29 DIAGNOSIS — Z713 Dietary counseling and surveillance: Secondary | ICD-10-CM | POA: Diagnosis not present

## 2024-04-29 DIAGNOSIS — J301 Allergic rhinitis due to pollen: Secondary | ICD-10-CM | POA: Diagnosis not present
